# Patient Record
Sex: Male | Born: 1950
Health system: Southern US, Community
[De-identification: ages and names within clinical notes are randomized; demographics above are authoritative.]

## PROBLEM LIST (undated history)

## (undated) DIAGNOSIS — T7840XA Allergy, unspecified, initial encounter: Secondary | ICD-10-CM

## (undated) DIAGNOSIS — Z87442 Personal history of urinary calculi: Secondary | ICD-10-CM

## (undated) DIAGNOSIS — K635 Polyp of colon: Secondary | ICD-10-CM

## (undated) DIAGNOSIS — I10 Essential (primary) hypertension: Secondary | ICD-10-CM

## (undated) DIAGNOSIS — M109 Gout, unspecified: Secondary | ICD-10-CM

## (undated) DIAGNOSIS — C801 Malignant (primary) neoplasm, unspecified: Secondary | ICD-10-CM

## (undated) DIAGNOSIS — N400 Enlarged prostate without lower urinary tract symptoms: Secondary | ICD-10-CM

## (undated) DIAGNOSIS — E785 Hyperlipidemia, unspecified: Secondary | ICD-10-CM

## (undated) DIAGNOSIS — F32A Depression, unspecified: Secondary | ICD-10-CM

## (undated) DIAGNOSIS — K219 Gastro-esophageal reflux disease without esophagitis: Secondary | ICD-10-CM

## (undated) DIAGNOSIS — G4730 Sleep apnea, unspecified: Secondary | ICD-10-CM

## (undated) DIAGNOSIS — D039 Melanoma in situ, unspecified: Secondary | ICD-10-CM

## (undated) DIAGNOSIS — I82409 Acute embolism and thrombosis of unspecified deep veins of unspecified lower extremity: Secondary | ICD-10-CM

## (undated) HISTORY — DX: Malignant (primary) neoplasm, unspecified: C80.1

## (undated) HISTORY — DX: Depression, unspecified: F32.A

## (undated) HISTORY — DX: Benign prostatic hyperplasia without lower urinary tract symptoms: N40.0

## (undated) HISTORY — DX: Polyp of colon: K63.5

## (undated) HISTORY — DX: Sleep apnea, unspecified: G47.30

## (undated) HISTORY — DX: Essential (primary) hypertension: I10

## (undated) HISTORY — DX: Acute embolism and thrombosis of unspecified deep veins of unspecified lower extremity: I82.409

## (undated) HISTORY — DX: Gout, unspecified: M10.9

## (undated) HISTORY — DX: Gastro-esophageal reflux disease without esophagitis: K21.9

## (undated) HISTORY — DX: Hyperlipidemia, unspecified: E78.5

## (undated) HISTORY — DX: Allergy, unspecified, initial encounter: T78.40XA

---
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
## 1997-01-05 DIAGNOSIS — I82409 Acute embolism and thrombosis of unspecified deep veins of unspecified lower extremity: Secondary | ICD-10-CM | POA: Insufficient documentation

## 1997-01-05 HISTORY — DX: Acute embolism and thrombosis of unspecified deep veins of unspecified lower extremity: I82.409

## 2010-05-13 ENCOUNTER — Ambulatory Visit
Admission: RE | Admit: 2010-05-13 | Discharge: 2010-05-13 | Disposition: A | Discharge status: Home / Self Care | Payer: No Typology Code available for payment source | Source: Ambulatory Visit | Attending: Physical Medicine and Rehabilitation | Admitting: Physical Medicine and Rehabilitation

## 2010-05-13 ENCOUNTER — Other Ambulatory Visit: Payer: Self-pay | Admitting: Physical Medicine and Rehabilitation

## 2010-05-13 DIAGNOSIS — Z0289 Encounter for other administrative examinations: Secondary | ICD-10-CM

## 2012-09-07 ENCOUNTER — Telehealth: Payer: Self-pay

## 2012-09-07 NOTE — Telephone Encounter (Signed)
Pt left v/m; moved to [place] area 3 years ago and pts doctor in [place] has been prescribing Lisinopril 40 mg for pt; [place] dr will not refill until seen. Pt has new pt appt 11/01/12. I called pt and he was out of med and pt went to [facility] to get med filled until can see Dr Alphonsus Sias on 11/01/12. Pt said he did not need anything further at this time.

## 2012-09-21 ENCOUNTER — Encounter: Payer: Self-pay | Admitting: Internal Medicine

## 2012-09-21 ENCOUNTER — Ambulatory Visit (INDEPENDENT_AMBULATORY_CARE_PROVIDER_SITE_OTHER): Payer: Managed Care, Other (non HMO) | Admitting: Internal Medicine

## 2012-09-21 VITALS — BP 128/80 | HR 71 | Temp 98.0°F | Ht 70.0 in | Wt 257.0 lb

## 2012-09-21 DIAGNOSIS — I10 Essential (primary) hypertension: Secondary | ICD-10-CM | POA: Insufficient documentation

## 2012-09-21 DIAGNOSIS — E785 Hyperlipidemia, unspecified: Secondary | ICD-10-CM

## 2012-09-21 DIAGNOSIS — M109 Gout, unspecified: Secondary | ICD-10-CM | POA: Insufficient documentation

## 2012-09-21 DIAGNOSIS — Z0000 Encounter for general adult medical examination without abnormal findings: Secondary | ICD-10-CM

## 2012-09-21 DIAGNOSIS — Z125 Encounter for screening for malignant neoplasm of prostate: Secondary | ICD-10-CM

## 2012-09-21 DIAGNOSIS — Z23 Encounter for immunization: Secondary | ICD-10-CM

## 2012-09-21 HISTORY — DX: Encounter for general adult medical examination without abnormal findings: Z00.00

## 2012-09-21 LAB — HEPATIC FUNCTION PANEL
ALT: 26 U/L (ref 0–53)
AST: 28 U/L (ref 0–37)
Alkaline Phosphatase: 58 U/L (ref 39–117)
Total Bilirubin: 1.1 mg/dL (ref 0.3–1.2)

## 2012-09-21 LAB — CBC WITH DIFFERENTIAL/PLATELET
Basophils Relative: 0.7 % (ref 0.0–3.0)
Eosinophils Relative: 1 % (ref 0.0–5.0)
HCT: 43.3 % (ref 39.0–52.0)
Lymphs Abs: 2.6 10*3/uL (ref 0.7–4.0)
MCV: 95.7 fl (ref 78.0–100.0)
Monocytes Absolute: 0.6 10*3/uL (ref 0.1–1.0)
Monocytes Relative: 8.9 % (ref 3.0–12.0)
Neutrophils Relative %: 52.4 % (ref 43.0–77.0)
Platelets: 275 10*3/uL (ref 150.0–400.0)
RBC: 4.52 Mil/uL (ref 4.22–5.81)
WBC: 7 10*3/uL (ref 4.5–10.5)

## 2012-09-21 LAB — BASIC METABOLIC PANEL
BUN: 13 mg/dL (ref 6–23)
Chloride: 104 mEq/L (ref 96–112)
GFR: 76.57 mL/min (ref >60.00)
Potassium: 4.2 mEq/L (ref 3.5–5.1)
Sodium: 141 mEq/L (ref 135–145)

## 2012-09-21 LAB — PSA: PSA: 0.33 ng/mL (ref 0.10–4.00)

## 2012-09-21 LAB — LIPID PANEL
Total CHOL/HDL Ratio: 6
Triglycerides: 147 mg/dL (ref 0.0–149.0)

## 2012-09-21 LAB — TSH: TSH: 3.77 u[IU]/mL (ref 0.35–5.50)

## 2012-09-21 LAB — LDL CHOLESTEROL, DIRECT: Direct LDL: 155.2 mg/dL

## 2012-09-21 LAB — T4, FREE: Free T4: 0.63 ng/dL (ref 0.60–1.60)

## 2012-09-21 MED ORDER — LISINOPRIL 40 MG PO TABS: 40.0000 mg | ORAL_TABLET | Freq: Every day | ORAL | Status: DC

## 2012-09-21 NOTE — Assessment & Plan Note (Signed)
Discussed primary prevention For now-- he wants to hold off

## 2012-09-21 NOTE — Assessment & Plan Note (Signed)
Healthy Working on fitness Will check PSA after discussion Flu, Tdap

## 2012-09-21 NOTE — Assessment & Plan Note (Signed)
BP Readings from Last 3 Encounters:  09/21/12 128/80   Good control Will continue meds Check labs
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
## 2012-09-22 NOTE — Addendum Note (Signed)
Addended by: Sueanne Margarita on: 09/22/2012 11:04 AM   Modules accepted: Orders

## 2012-11-01 ENCOUNTER — Ambulatory Visit: Payer: Self-pay | Admitting: Internal Medicine

## 2013-02-02 ENCOUNTER — Ambulatory Visit (INDEPENDENT_AMBULATORY_CARE_PROVIDER_SITE_OTHER): Payer: Managed Care, Other (non HMO) | Admitting: Internal Medicine

## 2013-02-02 ENCOUNTER — Encounter: Payer: Self-pay | Admitting: Internal Medicine

## 2013-02-02 VITALS — BP 108/60 | HR 98 | Temp 100.3°F | Wt 253.0 lb

## 2013-02-02 DIAGNOSIS — J069 Acute upper respiratory infection, unspecified: Secondary | ICD-10-CM | POA: Insufficient documentation
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
## 2013-02-02 NOTE — Assessment & Plan Note (Signed)
Really seems viral May be attenuated flu  Discussed supportive care If more focal sinus symptoms next week, would Rx amoxicillin

## 2013-02-02 NOTE — Progress Notes (Signed)
Pre-visit discussion using our clinic review tool. No additional management support is needed unless otherwise documented below in the visit note.  

## 2013-02-02 NOTE — Patient Instructions (Signed)

## 2013-02-08 ENCOUNTER — Encounter: Payer: Self-pay | Admitting: *Deleted

## 2013-09-25 ENCOUNTER — Encounter: Payer: Self-pay | Admitting: Internal Medicine

## 2013-09-25 ENCOUNTER — Ambulatory Visit (INDEPENDENT_AMBULATORY_CARE_PROVIDER_SITE_OTHER): Payer: Managed Care, Other (non HMO) | Admitting: Internal Medicine

## 2013-09-25 VITALS — BP 110/70 | HR 63 | Temp 98.2°F | Ht 70.0 in | Wt 244.0 lb

## 2013-09-25 DIAGNOSIS — I10 Essential (primary) hypertension: Secondary | ICD-10-CM

## 2013-09-25 DIAGNOSIS — E785 Hyperlipidemia, unspecified: Secondary | ICD-10-CM

## 2013-09-25 DIAGNOSIS — Z0000 Encounter for general adult medical examination without abnormal findings: Secondary | ICD-10-CM

## 2013-09-25 LAB — COMPREHENSIVE METABOLIC PANEL
ALBUMIN: 4.2 g/dL (ref 3.5–5.2)
ALK PHOS: 63 U/L (ref 39–117)
ALT: 29 U/L (ref 0–53)
AST: 25 U/L (ref 0–37)
BUN: 14 mg/dL (ref 6–23)
CALCIUM: 9.4 mg/dL (ref 8.4–10.5)
CHLORIDE: 106 meq/L (ref 96–112)
CO2: 31 mEq/L (ref 19–32)
Creatinine, Ser: 1 mg/dL (ref 0.4–1.5)
GFR: 78.9 mL/min (ref >60.00)
Glucose, Bld: 93 mg/dL (ref 70–99)
POTASSIUM: 4.6 meq/L (ref 3.5–5.1)
SODIUM: 142 meq/L (ref 135–145)
TOTAL PROTEIN: 7.8 g/dL (ref 6.0–8.3)
Total Bilirubin: 0.9 mg/dL (ref 0.2–1.2)

## 2013-09-25 LAB — CBC WITH DIFFERENTIAL/PLATELET
BASOS ABS: 0 10*3/uL (ref 0.0–0.1)
Basophils Relative: 0.5 % (ref 0.0–3.0)
EOS ABS: 0.1 10*3/uL (ref 0.0–0.7)
Eosinophils Relative: 1.3 % (ref 0.0–5.0)
HCT: 44 % (ref 39.0–52.0)
Hemoglobin: 14.8 g/dL (ref 13.0–17.0)
LYMPHS PCT: 27.7 % (ref 12.0–46.0)
Lymphs Abs: 1.6 10*3/uL (ref 0.7–4.0)
MCHC: 33.7 g/dL (ref 30.0–36.0)
MCV: 95.8 fl (ref 78.0–100.0)
MONO ABS: 0.4 10*3/uL (ref 0.1–1.0)
Monocytes Relative: 7.6 % (ref 3.0–12.0)
NEUTROS PCT: 62.9 % (ref 43.0–77.0)
Neutro Abs: 3.7 10*3/uL (ref 1.4–7.7)
PLATELETS: 274 10*3/uL (ref 150.0–400.0)
RBC: 4.59 Mil/uL (ref 4.22–5.81)
RDW: 13.4 % (ref 11.5–15.5)
WBC: 5.8 10*3/uL (ref 4.0–10.5)

## 2013-09-25 LAB — LIPID PANEL
CHOLESTEROL: 192 mg/dL (ref 0–200)
HDL: 27.4 mg/dL — ABNORMAL LOW (ref >39.00)
LDL CALC: 128 mg/dL — AB (ref 0–99)
NonHDL: 164.6
Total CHOL/HDL Ratio: 7
Triglycerides: 182 mg/dL — ABNORMAL HIGH (ref 0.0–149.0)
VLDL: 36.4 mg/dL (ref 0.0–40.0)

## 2013-09-25 LAB — T4, FREE: FREE T4: 0.65 ng/dL (ref 0.60–1.60)

## 2013-09-25 NOTE — Assessment & Plan Note (Signed)
BP Readings from Last 3 Encounters:  09/25/13 110/70  02/02/13 108/60  09/21/12 128/80   Will have him try cutting lisinopril in half
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
## 2013-09-25 NOTE — Assessment & Plan Note (Signed)
Healthy Really doing well with lifestyle UTD on cancer screening Will get flu vaccine at work

## 2013-09-25 NOTE — Progress Notes (Signed)
Pre visit review using our clinic review tool, if applicable. No additional management support is needed unless otherwise documented below in the visit note. 

## 2013-09-25 NOTE — Patient Instructions (Signed)
Please cut the lisinopril in half. Check your blood pressure after 3-4 weeks and stay on the lower dose as long as it is under 130/85.

## 2013-09-25 NOTE — Assessment & Plan Note (Signed)
Will recheck No meds for this at this point

## 2013-10-01 ENCOUNTER — Other Ambulatory Visit: Payer: Self-pay | Admitting: Internal Medicine

## 2014-10-03 ENCOUNTER — Encounter: Payer: Self-pay | Admitting: Internal Medicine

## 2014-10-03 ENCOUNTER — Ambulatory Visit (INDEPENDENT_AMBULATORY_CARE_PROVIDER_SITE_OTHER): Payer: 59 | Admitting: Internal Medicine

## 2014-10-03 VITALS — BP 112/72 | HR 60 | Temp 97.8°F | Ht 70.0 in | Wt 244.0 lb

## 2014-10-03 DIAGNOSIS — Z0000 Encounter for general adult medical examination without abnormal findings: Secondary | ICD-10-CM

## 2014-10-03 DIAGNOSIS — I10 Essential (primary) hypertension: Secondary | ICD-10-CM | POA: Diagnosis not present

## 2014-10-03 DIAGNOSIS — Z125 Encounter for screening for malignant neoplasm of prostate: Secondary | ICD-10-CM | POA: Diagnosis not present

## 2014-10-03 DIAGNOSIS — E785 Hyperlipidemia, unspecified: Secondary | ICD-10-CM

## 2014-10-03 LAB — CBC WITH DIFFERENTIAL/PLATELET
BASOS ABS: 0.1 10*3/uL (ref 0.0–0.1)
Basophils Relative: 0.9 % (ref 0.0–3.0)
EOS ABS: 0.1 10*3/uL (ref 0.0–0.7)
Eosinophils Relative: 1.5 % (ref 0.0–5.0)
HEMATOCRIT: 42.6 % (ref 39.0–52.0)
HEMOGLOBIN: 14.4 g/dL (ref 13.0–17.0)
LYMPHS PCT: 34 % (ref 12.0–46.0)
Lymphs Abs: 2 10*3/uL (ref 0.7–4.0)
MCHC: 33.8 g/dL (ref 30.0–36.0)
MCV: 95.2 fl (ref 78.0–100.0)
MONOS PCT: 9.9 % (ref 3.0–12.0)
Monocytes Absolute: 0.6 10*3/uL (ref 0.1–1.0)
Neutro Abs: 3.2 10*3/uL (ref 1.4–7.7)
Neutrophils Relative %: 53.7 % (ref 43.0–77.0)
Platelets: 250 10*3/uL (ref 150.0–400.0)
RBC: 4.48 Mil/uL (ref 4.22–5.81)
RDW: 13.5 % (ref 11.5–15.5)
WBC: 6 10*3/uL (ref 4.0–10.5)

## 2014-10-03 LAB — COMPREHENSIVE METABOLIC PANEL
ALBUMIN: 4.1 g/dL (ref 3.5–5.2)
ALT: 30 U/L (ref 0–53)
AST: 23 U/L (ref 0–37)
Alkaline Phosphatase: 61 U/L (ref 39–117)
BILIRUBIN TOTAL: 0.8 mg/dL (ref 0.2–1.2)
BUN: 13 mg/dL (ref 6–23)
CALCIUM: 9.3 mg/dL (ref 8.4–10.5)
CHLORIDE: 105 meq/L (ref 96–112)
CO2: 33 meq/L — AB (ref 19–32)
CREATININE: 1 mg/dL (ref 0.40–1.50)
GFR: 80.46 mL/min (ref >60.00)
Glucose, Bld: 89 mg/dL (ref 70–99)
Potassium: 4.4 mEq/L (ref 3.5–5.1)
Sodium: 142 mEq/L (ref 135–145)
Total Protein: 6.7 g/dL (ref 6.0–8.3)

## 2014-10-03 LAB — LIPID PANEL
CHOL/HDL RATIO: 6
Cholesterol: 193 mg/dL (ref 0–200)
HDL: 32.5 mg/dL — AB (ref >39.00)
LDL Cholesterol: 129 mg/dL — ABNORMAL HIGH (ref 0–99)
NONHDL: 160.35
Triglycerides: 158 mg/dL — ABNORMAL HIGH (ref 0.0–149.0)
VLDL: 31.6 mg/dL (ref 0.0–40.0)

## 2014-10-03 LAB — PSA: PSA: 0.38 ng/mL (ref 0.10–4.00)

## 2014-10-03 MED ORDER — LISINOPRIL 20 MG PO TABS: 20.0000 mg | ORAL_TABLET | Freq: Every day | ORAL | Status: DC

## 2014-10-03 NOTE — Patient Instructions (Signed)
Please try to get the colonoscopy report and pathology report (if done on polyps) from [facility].

## 2014-10-03 NOTE — Assessment & Plan Note (Signed)
Healthy and trying to work on lifestyle May be due for colon next year--he will try to get records from [facility] Will check PSA after discussion Flu vaccine at work

## 2014-10-03 NOTE — Assessment & Plan Note (Signed)
Discussed primary prevention Mostly low HDL No FH of early CAD--will hold off on meds

## 2014-10-03 NOTE — Progress Notes (Signed)
Pre visit review using our clinic review tool, if applicable. No additional management support is needed unless otherwise documented below in the visit note. 
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
## 2014-10-03 NOTE — Assessment & Plan Note (Signed)
BP Readings from Last 3 Encounters:  10/03/14 112/72  09/25/13 110/70  02/02/13 108/60   Good control No change

## 2014-10-03 NOTE — Addendum Note (Signed)
Addended by: Despina Hidden on: 10/03/2014 11:39 AM   Modules accepted: Orders, Medications

## 2015-10-04 ENCOUNTER — Encounter: Payer: Self-pay | Admitting: Internal Medicine

## 2015-10-04 ENCOUNTER — Ambulatory Visit (INDEPENDENT_AMBULATORY_CARE_PROVIDER_SITE_OTHER): Payer: 59 | Admitting: Internal Medicine

## 2015-10-04 ENCOUNTER — Other Ambulatory Visit: Payer: Self-pay | Admitting: Internal Medicine

## 2015-10-04 VITALS — BP 122/86 | HR 67 | Temp 98.4°F | Ht 70.0 in | Wt 238.0 lb

## 2015-10-04 DIAGNOSIS — Z0000 Encounter for general adult medical examination without abnormal findings: Secondary | ICD-10-CM

## 2015-10-04 DIAGNOSIS — I10 Essential (primary) hypertension: Secondary | ICD-10-CM | POA: Diagnosis not present

## 2015-10-04 DIAGNOSIS — N400 Enlarged prostate without lower urinary tract symptoms: Secondary | ICD-10-CM | POA: Insufficient documentation

## 2015-10-04 LAB — CBC WITH DIFFERENTIAL/PLATELET
BASOS ABS: 0 10*3/uL (ref 0.0–0.1)
BASOS PCT: 0.5 % (ref 0.0–3.0)
EOS ABS: 0.1 10*3/uL (ref 0.0–0.7)
Eosinophils Relative: 1.1 % (ref 0.0–5.0)
HCT: 43.3 % (ref 39.0–52.0)
Hemoglobin: 14.8 g/dL (ref 13.0–17.0)
LYMPHS ABS: 1.9 10*3/uL (ref 0.7–4.0)
Lymphocytes Relative: 32.5 % (ref 12.0–46.0)
MCHC: 34.3 g/dL (ref 30.0–36.0)
MCV: 95.1 fl (ref 78.0–100.0)
MONO ABS: 0.5 10*3/uL (ref 0.1–1.0)
Monocytes Relative: 8.7 % (ref 3.0–12.0)
NEUTROS PCT: 57.2 % (ref 43.0–77.0)
Neutro Abs: 3.4 10*3/uL (ref 1.4–7.7)
PLATELETS: 248 10*3/uL (ref 150.0–400.0)
RBC: 4.55 Mil/uL (ref 4.22–5.81)
RDW: 13.5 % (ref 11.5–15.5)
WBC: 6 10*3/uL (ref 4.0–10.5)

## 2015-10-04 LAB — LIPID PANEL
Cholesterol: 200 mg/dL (ref 0–200)
HDL: 32.3 mg/dL — AB (ref >39.00)
LDL Cholesterol: 147 mg/dL — ABNORMAL HIGH (ref 0–99)
NonHDL: 168.16
TRIGLYCERIDES: 108 mg/dL (ref 0.0–149.0)
Total CHOL/HDL Ratio: 6
VLDL: 21.6 mg/dL (ref 0.0–40.0)

## 2015-10-04 LAB — COMPREHENSIVE METABOLIC PANEL
ALT: 20 U/L (ref 0–53)
AST: 23 U/L (ref 0–37)
Albumin: 4 g/dL (ref 3.5–5.2)
Alkaline Phosphatase: 60 U/L (ref 39–117)
BILIRUBIN TOTAL: 0.9 mg/dL (ref 0.2–1.2)
BUN: 12 mg/dL (ref 6–23)
CALCIUM: 9 mg/dL (ref 8.4–10.5)
CO2: 31 meq/L (ref 19–32)
CREATININE: 0.99 mg/dL (ref 0.40–1.50)
Chloride: 105 mEq/L (ref 96–112)
GFR: 81.13 mL/min (ref >60.00)
GLUCOSE: 79 mg/dL (ref 70–99)
Potassium: 4.3 mEq/L (ref 3.5–5.1)
SODIUM: 141 meq/L (ref 135–145)
Total Protein: 7.4 g/dL (ref 6.0–8.3)

## 2015-10-04 MED ORDER — ZOSTER VACCINE LIVE 19400 UNT/0.65ML ~~LOC~~ SUSR: 0.6500 mL | Freq: Once | SUBCUTANEOUS | 0 refills | Status: AC

## 2015-10-04 NOTE — Assessment & Plan Note (Signed)
BP Readings from Last 3 Encounters:  10/04/15 122/86  10/03/14 112/72  09/25/13 110/70   Good control No changes needed
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
## 2015-10-04 NOTE — Assessment & Plan Note (Signed)
Healthy Has improved fitness and gradual weight loss Flu vaccine at work Rx for zostavax Defer PSA till at least next year Need his colon records--may be due for repeat now

## 2015-10-04 NOTE — Progress Notes (Signed)
Pre visit review using our clinic review tool, if applicable. No additional management support is needed unless otherwise documented below in the visit note. 

## 2015-10-04 NOTE — Assessment & Plan Note (Signed)
Mild symptoms ?No Rx needed ?

## 2015-10-08 LAB — NICOTINE/COTININE METABOLITES: Cotinine: NEGATIVE

## 2015-12-10 ENCOUNTER — Ambulatory Visit (INDEPENDENT_AMBULATORY_CARE_PROVIDER_SITE_OTHER): Payer: 59 | Admitting: Family Medicine

## 2015-12-10 ENCOUNTER — Encounter: Payer: Self-pay | Admitting: Family Medicine

## 2015-12-10 VITALS — BP 122/84 | HR 80 | Temp 98.5°F | Ht 70.0 in | Wt 246.8 lb

## 2015-12-10 DIAGNOSIS — J0100 Acute maxillary sinusitis, unspecified: Secondary | ICD-10-CM

## 2015-12-10 DIAGNOSIS — J019 Acute sinusitis, unspecified: Secondary | ICD-10-CM | POA: Insufficient documentation

## 2015-12-10 MED ORDER — AMOXICILLIN-POT CLAVULANATE 875-125 MG PO TABS: 1.0000 | ORAL_TABLET | Freq: Two times a day (BID) | ORAL | 0 refills | Status: DC

## 2015-12-10 NOTE — Progress Notes (Signed)
Pre visit review using our clinic review tool, if applicable. No additional management support is needed unless otherwise documented below in the visit note. 

## 2015-12-10 NOTE — Patient Instructions (Signed)
I think you have a sinus infection  Drink lots of fluids  Take the augmentin as directed  Use your sinus rinse  Breathe steam when you can   Tylenol is fine for pain or fever   Update if not starting to improve in a week or if worsening

## 2015-12-10 NOTE — Progress Notes (Signed)
   Subjective:    Patient ID: Cody Romero, male    DOB: [birth year], 65 y.o.   MRN: [id number]  HPI Here for 2 weeks of uri symptoms   Started with a productive cough - mucous was light green  That has calmed down   Felt a little better and then worse  A lot of facial pain - worse on the left side  Kept him in bed this weekend   Lots of nasal mucous- dark green  ? If fever- felt hot at work yesterday  Has had chills as well   Ears pop a bit  No ST   Over the counter- took ES sudafed  Also mucinex (tried sinus formula and then another for chest congestion)   Tylenol helps more than anything   He is allergic to cefzil but can take penicillin    Review of Systems  Constitutional: Positive for appetite change. Negative for activity change, fatigue and fever.  HENT: Positive for congestion, ear pain, postnasal drip, rhinorrhea, sinus pressure and sore throat. Negative for nosebleeds.   Eyes: Negative for pain, redness, itching and visual disturbance.  Respiratory: Positive for cough. Negative for shortness of breath and wheezing.   Cardiovascular: Negative for chest pain and leg swelling.  Gastrointestinal: Negative for abdominal distention, abdominal pain, constipation, diarrhea, nausea and vomiting.  Endocrine: Negative for cold intolerance, polydipsia and polyuria.  Genitourinary: Negative for difficulty urinating, dysuria, flank pain, frequency, hematuria and urgency.  Musculoskeletal: Negative for arthralgias and myalgias.  Skin: Negative for rash.  Allergic/Immunologic: Negative for immunocompromised state.  Neurological: Positive for headaches. Negative for dizziness, tremors, syncope, weakness and numbness.  Hematological: Negative for adenopathy. Does not bruise/bleed easily.  Psychiatric/Behavioral: Negative for dysphoric mood. The patient is not nervous/anxious.        Objective:   Physical Exam  Constitutional: He appears well-developed and well-nourished. No  distress.  Well appearing   HENT:  Head: Normocephalic and atraumatic.  Right Ear: External ear normal.  Left Ear: External ear normal.  Mouth/Throat: Oropharynx is clear and moist. No oropharyngeal exudate.  Mild maxillary sinus tenderness Nares are injected and congested     Eyes: Conjunctivae and EOM are normal. Pupils are equal, round, and reactive to light. Right eye exhibits no discharge. Left eye exhibits no discharge.  Neck: Normal range of motion. Neck supple.  Cardiovascular: Normal rate and regular rhythm.   Pulmonary/Chest: Effort normal and breath sounds normal. No respiratory distress. He has no wheezes. He has no rales. He exhibits no tenderness.  Lymphadenopathy:    He has no cervical adenopathy.  Neurological: He is alert. No cranial nerve deficit.  Skin: Skin is warm and dry. No rash noted. No erythema.  Psychiatric: He has a normal mood and affect.          Assessment & Plan:   Problem List Items Addressed This Visit      Respiratory   Acute sinusitis    After 2 wk of uri symptoms - with sinus pain and purulent nasal drainage Cover with augmentin (pt is all to cefzil but can take augmentin)  Fluids/rest/nasal saline Disc symptomatic care - see instructions on AVS  Update if not starting to improve in a week or if worsening        Relevant Medications   amoxicillin-clavulanate (AUGMENTIN) 875-125 MG tablet

## 2015-12-10 NOTE — Assessment & Plan Note (Signed)
After 2 wk of uri symptoms - with sinus pain and purulent nasal drainage Cover with augmentin (pt is all to cefzil but can take augmentin)  Fluids/rest/nasal saline Disc symptomatic care - see instructions on AVS  Update if not starting to improve in a week or if worsening

## 2015-12-20 ENCOUNTER — Telehealth: Payer: Self-pay

## 2015-12-20 MED ORDER — AMOXICILLIN-POT CLAVULANATE 875-125 MG PO TABS: 1.0000 | ORAL_TABLET | Freq: Two times a day (BID) | ORAL | 0 refills | Status: DC

## 2015-12-20 NOTE — Telephone Encounter (Signed)
Pt left v/m requesting refill augmentin; pt was seen 12/10/15; pt has finished abx and is better with sinus infection but symptoms have not completely cleared and request refill abx to get completely clear of symptoms.[facility]. Pt request cb.

## 2015-12-20 NOTE — Telephone Encounter (Signed)
Rx sent to pharmacy and pt advise and advise of Dr. Marliss Coots comments

## 2015-12-20 NOTE — Telephone Encounter (Signed)
Please refill for another 5 days F/u if no further improvement  Hope this helps

## 2016-04-20 ENCOUNTER — Ambulatory Visit (INDEPENDENT_AMBULATORY_CARE_PROVIDER_SITE_OTHER): Payer: 59 | Admitting: Internal Medicine

## 2016-04-20 ENCOUNTER — Encounter: Payer: Self-pay | Admitting: Internal Medicine

## 2016-04-20 VITALS — BP 116/72 | HR 92 | Temp 99.1°F | Wt 251.8 lb

## 2016-04-20 DIAGNOSIS — J321 Chronic frontal sinusitis: Secondary | ICD-10-CM | POA: Insufficient documentation

## 2016-04-20 MED ORDER — FLUTICASONE PROPIONATE 50 MCG/ACT NA SUSP: 2.0000 | Freq: Two times a day (BID) | NASAL | 12 refills | Status: DC

## 2016-04-20 MED ORDER — AMOXICILLIN-POT CLAVULANATE 875-125 MG PO TABS: 1.0000 | ORAL_TABLET | Freq: Two times a day (BID) | ORAL | 0 refills | Status: AC

## 2016-04-20 NOTE — Patient Instructions (Signed)
If you are not getting better over the next few weeks, I will set you up with an ENT specialist.

## 2016-04-20 NOTE — Progress Notes (Signed)
Pre visit review using our clinic review tool, if applicable. No additional management support is needed unless otherwise documented below in the visit note. 

## 2016-04-20 NOTE — Assessment & Plan Note (Signed)
Ongoing symptoms for 4-5 months Discussed nasal irrigation and flonase Will give augmentin for 3 weeks Will proceed with ENT eval if persists
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
## 2016-09-16 ENCOUNTER — Ambulatory Visit (INDEPENDENT_AMBULATORY_CARE_PROVIDER_SITE_OTHER): Payer: 59 | Admitting: Internal Medicine

## 2016-09-16 ENCOUNTER — Encounter: Payer: Self-pay | Admitting: Internal Medicine

## 2016-09-16 VITALS — BP 114/74 | HR 65 | Temp 98.0°F | Ht 69.25 in | Wt 247.0 lb

## 2016-09-16 DIAGNOSIS — I10 Essential (primary) hypertension: Secondary | ICD-10-CM | POA: Diagnosis not present

## 2016-09-16 DIAGNOSIS — D225 Melanocytic nevi of trunk: Secondary | ICD-10-CM | POA: Diagnosis not present

## 2016-09-16 DIAGNOSIS — Z125 Encounter for screening for malignant neoplasm of prostate: Secondary | ICD-10-CM | POA: Diagnosis not present

## 2016-09-16 DIAGNOSIS — Z85820 Personal history of malignant melanoma of skin: Secondary | ICD-10-CM

## 2016-09-16 DIAGNOSIS — Z0000 Encounter for general adult medical examination without abnormal findings: Secondary | ICD-10-CM | POA: Diagnosis not present

## 2016-09-16 HISTORY — DX: Personal history of malignant melanoma of skin: Z85.820

## 2016-09-16 LAB — LIPID PANEL
Cholesterol: 220 mg/dL — ABNORMAL HIGH (ref 0–200)
HDL: 42.1 mg/dL (ref >39.00)
LDL Cholesterol: 141 mg/dL — ABNORMAL HIGH (ref 0–99)
NONHDL: 178.34
Total CHOL/HDL Ratio: 5
Triglycerides: 188 mg/dL — ABNORMAL HIGH (ref 0.0–149.0)
VLDL: 37.6 mg/dL (ref 0.0–40.0)

## 2016-09-16 LAB — CBC WITH DIFFERENTIAL/PLATELET
BASOS ABS: 0.1 10*3/uL (ref 0.0–0.1)
Basophils Relative: 0.9 % (ref 0.0–3.0)
Eosinophils Absolute: 0.1 10*3/uL (ref 0.0–0.7)
Eosinophils Relative: 1.7 % (ref 0.0–5.0)
HEMATOCRIT: 43.9 % (ref 39.0–52.0)
Hemoglobin: 14.7 g/dL (ref 13.0–17.0)
LYMPHS PCT: 37 % (ref 12.0–46.0)
Lymphs Abs: 2.2 10*3/uL (ref 0.7–4.0)
MCHC: 33.4 g/dL (ref 30.0–36.0)
MCV: 98.5 fl (ref 78.0–100.0)
MONOS PCT: 10.3 % (ref 3.0–12.0)
Monocytes Absolute: 0.6 10*3/uL (ref 0.1–1.0)
NEUTROS ABS: 3 10*3/uL (ref 1.4–7.7)
Neutrophils Relative %: 50.1 % (ref 43.0–77.0)
PLATELETS: 259 10*3/uL (ref 150.0–400.0)
RBC: 4.46 Mil/uL (ref 4.22–5.81)
RDW: 13.7 % (ref 11.5–15.5)
WBC: 5.9 10*3/uL (ref 4.0–10.5)

## 2016-09-16 LAB — COMPREHENSIVE METABOLIC PANEL
ALT: 24 U/L (ref 0–53)
AST: 21 U/L (ref 0–37)
Albumin: 4.2 g/dL (ref 3.5–5.2)
Alkaline Phosphatase: 62 U/L (ref 39–117)
BILIRUBIN TOTAL: 0.8 mg/dL (ref 0.2–1.2)
BUN: 17 mg/dL (ref 6–23)
CALCIUM: 9.7 mg/dL (ref 8.4–10.5)
CO2: 30 meq/L (ref 19–32)
Chloride: 103 mEq/L (ref 96–112)
Creatinine, Ser: 1.06 mg/dL (ref 0.40–1.50)
GFR: 74.75 mL/min (ref >60.00)
GLUCOSE: 94 mg/dL (ref 70–99)
POTASSIUM: 4.8 meq/L (ref 3.5–5.1)
Sodium: 141 mEq/L (ref 135–145)
Total Protein: 6.8 g/dL (ref 6.0–8.3)

## 2016-09-16 LAB — PSA: PSA: 0.42 ng/mL (ref 0.10–4.00)

## 2016-09-16 NOTE — Assessment & Plan Note (Signed)
BP Readings from Last 3 Encounters:  09/16/16 114/74  04/20/16 116/72  12/10/15 122/84   Good control

## 2016-09-16 NOTE — Assessment & Plan Note (Signed)
Healthy but discussed obesity DASH info  Exercising regularly Had colon 2010--2 polyps (will get pathology and set up if adenomatous) Discussed PSA--will check

## 2016-09-16 NOTE — Assessment & Plan Note (Signed)
Will set up with derm 

## 2016-09-16 NOTE — Patient Instructions (Signed)
DASH Eating Plan DASH stands for "Dietary Approaches to Stop Hypertension." The DASH eating plan is a healthy eating plan that has been shown to reduce high blood pressure (hypertension). It may also reduce your risk for type 2 diabetes, heart disease, and stroke. The DASH eating plan may also help with weight loss. What are tips for following this plan? General guidelines  Avoid eating more than 2,300 mg (milligrams) of salt (sodium) a day. If you have hypertension, you may need to reduce your sodium intake to 1,500 mg a day.  Limit alcohol intake to no more than 1 drink a day for nonpregnant women and 2 drinks a day for men. One drink equals 12 oz of beer, 5 oz of wine, or 1 oz of hard liquor.  Work with your health care provider to maintain a healthy body weight or to lose weight. Ask what an ideal weight is for you.  Get at least 30 minutes of exercise that causes your heart to beat faster (aerobic exercise) most days of the week. Activities may include walking, swimming, or biking.  Work with your health care provider or diet and nutrition specialist (dietitian) to adjust your eating plan to your individual calorie needs. Reading food labels  Check food labels for the amount of sodium per serving. Choose foods with less than 5 percent of the Daily Value of sodium. Generally, foods with less than 300 mg of sodium per serving fit into this eating plan.  To find whole grains, look for the word "whole" as the first word in the ingredient list. Shopping  Buy products labeled as "low-sodium" or "no salt added."  Buy fresh foods. Avoid canned foods and premade or frozen meals. Cooking  Avoid adding salt when cooking. Use salt-free seasonings or herbs instead of table salt or sea salt. Check with your health care provider or pharmacist before using salt substitutes.  Do not fry foods. Cook foods using healthy methods such as baking, boiling, grilling, and broiling instead.  Cook with  heart-healthy oils, such as olive, canola, soybean, or sunflower oil. Meal planning   Eat a balanced diet that includes: ? 5 or more servings of fruits and vegetables each day. At each meal, try to fill half of your plate with fruits and vegetables. ? Up to 6-8 servings of whole grains each day. ? Less than 6 oz of lean meat, poultry, or fish each day. A 3-oz serving of meat is about the same size as a deck of cards. One egg equals 1 oz. ? 2 servings of low-fat dairy each day. ? A serving of nuts, seeds, or beans 5 times each week. ? Heart-healthy fats. Healthy fats called Omega-3 fatty acids are found in foods such as flaxseeds and coldwater fish, like sardines, salmon, and mackerel.  Limit how much you eat of the following: ? Canned or prepackaged foods. ? Food that is high in trans fat, such as fried foods. ? Food that is high in saturated fat, such as fatty meat. ? Sweets, desserts, sugary drinks, and other foods with added sugar. ? Full-fat dairy products.  Do not salt foods before eating.  Try to eat at least 2 vegetarian meals each week.  Eat more home-cooked food and less restaurant, buffet, and fast food.  When eating at a restaurant, ask that your food be prepared with less salt or no salt, if possible. What foods are recommended? The items listed may not be a complete list. Talk with your dietitian about what   dietary choices are best for you. Grains Whole-grain or whole-wheat bread. Whole-grain or whole-wheat pasta. Brown rice. Oatmeal. Quinoa. Bulgur. Whole-grain and low-sodium cereals. Pita bread. Low-fat, low-sodium crackers. Whole-wheat flour tortillas. Vegetables Fresh or frozen vegetables (raw, steamed, roasted, or grilled). Low-sodium or reduced-sodium tomato and vegetable juice. Low-sodium or reduced-sodium tomato sauce and tomato paste. Low-sodium or reduced-sodium canned vegetables. Fruits All fresh, dried, or frozen fruit. Canned fruit in natural juice (without  added sugar). Meat and other protein foods Skinless chicken or turkey. Ground chicken or turkey. Pork with fat trimmed off. Fish and seafood. Egg whites. Dried beans, peas, or lentils. Unsalted nuts, nut butters, and seeds. Unsalted canned beans. Lean cuts of beef with fat trimmed off. Low-sodium, lean deli meat. Dairy Low-fat (1%) or fat-free (skim) milk. Fat-free, low-fat, or reduced-fat cheeses. Nonfat, low-sodium ricotta or cottage cheese. Low-fat or nonfat yogurt. Low-fat, low-sodium cheese. Fats and oils Soft margarine without trans fats. Vegetable oil. Low-fat, reduced-fat, or light mayonnaise and salad dressings (reduced-sodium). Canola, safflower, olive, soybean, and sunflower oils. Avocado. Seasoning and other foods Herbs. Spices. Seasoning mixes without salt. Unsalted popcorn and pretzels. Fat-free sweets. What foods are not recommended? The items listed may not be a complete list. Talk with your dietitian about what dietary choices are best for you. Grains Baked goods made with fat, such as croissants, muffins, or some breads. Dry pasta or rice meal packs. Vegetables Creamed or fried vegetables. Vegetables in a cheese sauce. Regular canned vegetables (not low-sodium or reduced-sodium). Regular canned tomato sauce and paste (not low-sodium or reduced-sodium). Regular tomato and vegetable juice (not low-sodium or reduced-sodium). Pickles. Olives. Fruits Canned fruit in a light or heavy syrup. Fried fruit. Fruit in cream or butter sauce. Meat and other protein foods Fatty cuts of meat. Ribs. Fried meat. Bacon. Sausage. Bologna and other processed lunch meats. Salami. Fatback. Hotdogs. Bratwurst. Salted nuts and seeds. Canned beans with added salt. Canned or smoked fish. Whole eggs or egg yolks. Chicken or turkey with skin. Dairy Whole or 2% milk, cream, and half-and-half. Whole or full-fat cream cheese. Whole-fat or sweetened yogurt. Full-fat cheese. Nondairy creamers. Whipped toppings.  Processed cheese and cheese spreads. Fats and oils Butter. Stick margarine. Lard. Shortening. Ghee. Bacon fat. Tropical oils, such as coconut, palm kernel, or palm oil. Seasoning and other foods Salted popcorn and pretzels. Onion salt, garlic salt, seasoned salt, table salt, and sea salt. Worcestershire sauce. Tartar sauce. Barbecue sauce. Teriyaki sauce. Soy sauce, including reduced-sodium. Steak sauce. Canned and packaged gravies. Fish sauce. Oyster sauce. Cocktail sauce. Horseradish that you find on the shelf. Ketchup. Mustard. Meat flavorings and tenderizers. Bouillon cubes. Hot sauce and Tabasco sauce. Premade or packaged marinades. Premade or packaged taco seasonings. Relishes. Regular salad dressings. Where to find more information:  National Heart, Lung, and Blood Institute: www.nhlbi.nih.gov  American Heart Association: www.heart.org Summary  The DASH eating plan is a healthy eating plan that has been shown to reduce high blood pressure (hypertension). It may also reduce your risk for type 2 diabetes, heart disease, and stroke.  With the DASH eating plan, you should limit salt (sodium) intake to 2,300 mg a day. If you have hypertension, you may need to reduce your sodium intake to 1,500 mg a day.  When on the DASH eating plan, aim to eat more fresh fruits and vegetables, whole grains, lean proteins, low-fat dairy, and heart-healthy fats.  Work with your health care provider or diet and nutrition specialist (dietitian) to adjust your eating plan to your individual   calorie needs. This information is not intended to replace advice given to you by your health care provider. Make sure you discuss any questions you have with your health care provider. Document Released: 12/11/2010 Document Revised: 12/16/2015 Document Reviewed: 12/16/2015 Elsevier Interactive Patient Education  2017 Elsevier Inc.  
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
## 2016-10-05 ENCOUNTER — Other Ambulatory Visit: Payer: Self-pay | Admitting: Internal Medicine

## 2016-10-06 ENCOUNTER — Encounter: Payer: 59 | Admitting: Internal Medicine

## 2017-01-18 ENCOUNTER — Telehealth: Payer: Self-pay

## 2017-01-18 DIAGNOSIS — D369 Benign neoplasm, unspecified site: Secondary | ICD-10-CM

## 2017-01-18 NOTE — Telephone Encounter (Signed)
Spoke to pt about his previous colonoscopy and that he is due for another one. He said either [facility] or [place] is fine. He lives between both.   He also wanted Dr Silvio Pate to know that the dermatology appt for the spot on his back was moved from 01-07-17 to some time in February

## 2017-01-18 NOTE — Telephone Encounter (Signed)
Referral placed.

## 2017-02-17 ENCOUNTER — Other Ambulatory Visit: Payer: Self-pay

## 2017-02-18 ENCOUNTER — Telehealth: Payer: Self-pay | Admitting: Gastroenterology

## 2017-02-18 NOTE — Telephone Encounter (Signed)
Records have been placed on DOD 01/18/17 Dr.Armbruster's desk for review.

## 2017-02-23 ENCOUNTER — Encounter: Payer: Self-pay | Admitting: Gastroenterology

## 2017-03-16 ENCOUNTER — Ambulatory Visit: Payer: 59

## 2017-03-16 ENCOUNTER — Other Ambulatory Visit: Payer: Self-pay

## 2017-03-16 ENCOUNTER — Encounter: Payer: Self-pay | Admitting: Gastroenterology

## 2017-03-16 VITALS — Ht 69.5 in | Wt 264.4 lb

## 2017-03-16 DIAGNOSIS — Z86010 Personal history of colonic polyps: Secondary | ICD-10-CM

## 2017-03-16 MED ORDER — NA SULFATE-K SULFATE-MG SULF 17.5-3.13-1.6 GM/177ML PO SOLN: 1.0000 | Freq: Once | ORAL | 0 refills | Status: AC

## 2017-03-16 NOTE — Progress Notes (Signed)
Denies allergies to eggs or soy products. Denies complication of anesthesia or sedation. Denies use of weight loss medication. Denies use of O2.   Emmi instructions declined.  

## 2017-03-30 ENCOUNTER — Other Ambulatory Visit: Payer: Self-pay

## 2017-03-30 ENCOUNTER — Ambulatory Visit (AMBULATORY_SURGERY_CENTER): Payer: 59 | Admitting: Gastroenterology

## 2017-03-30 ENCOUNTER — Encounter: Payer: Self-pay | Admitting: Gastroenterology

## 2017-03-30 VITALS — BP 136/87 | HR 60 | Temp 98.4°F | Resp 24 | Ht 69.5 in | Wt 264.0 lb

## 2017-03-30 DIAGNOSIS — D124 Benign neoplasm of descending colon: Secondary | ICD-10-CM | POA: Diagnosis not present

## 2017-03-30 DIAGNOSIS — D122 Benign neoplasm of ascending colon: Secondary | ICD-10-CM

## 2017-03-30 DIAGNOSIS — Z860100 Personal history of colon polyps, unspecified: Secondary | ICD-10-CM

## 2017-03-30 DIAGNOSIS — D123 Benign neoplasm of transverse colon: Secondary | ICD-10-CM

## 2017-03-30 DIAGNOSIS — D120 Benign neoplasm of cecum: Secondary | ICD-10-CM

## 2017-03-30 MED ORDER — SODIUM CHLORIDE 0.9 % IV SOLN
500.0000 mL | Freq: Once | INTRAVENOUS | Status: DC
Start: 2017-03-30 — End: 2017-06-15

## 2017-03-30 NOTE — Patient Instructions (Signed)
Discharge instructions given. Handouts on polyps,diverticulosis and hemorrhoids. Resume previous medications. YOU HAD AN ENDOSCOPIC PROCEDURE TODAY AT THE [facility]:   Refer to the procedure report that was given to you for any specific questions about what was found during the examination.  If the procedure report does not answer your questions, please call your gastroenterologist to clarify.  If you requested that your care partner not be given the details of your procedure findings, then the procedure report has been included in a sealed envelope for you to review at your convenience later.  YOU SHOULD EXPECT: Some feelings of bloating in the abdomen. Passage of more gas than usual.  Walking can help get rid of the air that was put into your GI tract during the procedure and reduce the bloating. If you had a lower endoscopy (such as a colonoscopy or flexible sigmoidoscopy) you may notice spotting of blood in your stool or on the toilet paper. If you underwent a bowel prep for your procedure, you may not have a normal bowel movement for a few days.  Please Note:  You might notice some irritation and congestion in your nose or some drainage.  This is from the oxygen used during your procedure.  There is no need for concern and it should clear up in a day or so.  SYMPTOMS TO REPORT IMMEDIATELY:   Following lower endoscopy (colonoscopy or flexible sigmoidoscopy):  Excessive amounts of blood in the stool  Significant tenderness or worsening of abdominal pains  Swelling of the abdomen that is new, acute  Fever of 100F or higher   For urgent or emergent issues, a gastroenterologist can be reached at any hour by calling [phone].   DIET:  We do recommend a small meal at first, but then you may proceed to your regular diet.  Drink plenty of fluids but you should avoid alcoholic beverages for 24 hours.  ACTIVITY:  You should plan to take it easy for the rest of today and you  should NOT DRIVE or use heavy machinery until tomorrow (because of the sedation medicines used during the test).    FOLLOW UP: Our staff will call the number listed on your records the next business day following your procedure to check on you and address any questions or concerns that you may have regarding the information given to you following your procedure. If we do not reach you, we will leave a message.  However, if you are feeling well and you are not experiencing any problems, there is no need to return our call.  We will assume that you have returned to your regular daily activities without incident.  If any biopsies were taken you will be contacted by phone or by letter within the next 1-3 weeks.  Please call us at [phone] if you have not heard about the biopsies in 3 weeks.    SIGNATURES/CONFIDENTIALITY: You and/or your care partner have signed paperwork which will be entered into your electronic medical record.  These signatures attest to the fact that that the information above on your After Visit Summary has been reviewed and is understood.  Full responsibility of the confidentiality of this discharge information lies with you and/or your care-partner. 

## 2017-03-30 NOTE — Progress Notes (Signed)
Called to room to assist during endoscopic procedure.  Patient ID and intended procedure confirmed with present staff. Received instructions for my participation in the procedure from the performing physician.  

## 2017-03-30 NOTE — Progress Notes (Signed)
Report given to PACU, vss 

## 2017-03-30 NOTE — Progress Notes (Signed)
Pt's states no medical or surgical changes since previsit or office visit. 

## 2017-03-30 NOTE — Op Note (Signed)
[place] Patient Name: Cody Romero Procedure Date: 03/30/2017 1:52 PM MRN: [id number] Endoscopist: Remo Lipps P. Tionne Carelli MD, MD Age: 67 Referring MD:  Date of Birth: 06/25/1950 Gender: Male Account #: [id number] Procedure:                Colonoscopy Indications:              Surveillance: Personal history of adenomatous                            polyps on last colonoscopy > 5 years ago (2010) Medicines:                Monitored Anesthesia Care Procedure:                Pre-Anesthesia Assessment:                           - Prior to the procedure, a History and Physical                            was performed, and patient medications and                            allergies were reviewed. The patient's tolerance of                            previous anesthesia was also reviewed. The risks                            and benefits of the procedure and the sedation                            options and risks were discussed with the patient.                            All questions were answered, and informed consent                            was obtained. Prior Anticoagulants: The patient has                            taken no previous anticoagulant or antiplatelet                            agents. ASA Grade Assessment: II - A patient with                            mild systemic disease. After reviewing the risks                            and benefits, the patient was deemed in                            satisfactory condition to undergo the procedure.  After obtaining informed consent, the colonoscope                            was passed under direct vision. Throughout the                            procedure, the patient's blood pressure, pulse, and                            oxygen saturations were monitored continuously. The                            Colonoscope was introduced through the anus and                            advanced to  the the cecum, identified by                            appendiceal orifice and ileocecal valve. The                            colonoscopy was performed without difficulty. The                            patient tolerated the procedure well. The quality                            of the bowel preparation was adequate. The                            ileocecal valve, appendiceal orifice, and rectum                            were photographed. Scope In: 2:09:14 PM Scope Out: 2:32:28 PM Scope Withdrawal Time: 0 hours 19 minutes 39 seconds  Total Procedure Duration: 0 hours 23 minutes 14 seconds  Findings:                 The perianal and digital rectal examinations were                            normal.                           A 6 mm polyp was found in the cecum. The polyp was                            sessile. The polyp was removed with a cold snare.                            Resection and retrieval were complete.                           Three sessile polyps were found in the ascending  colon. The polyps were 3 to 4 mm in size. These                            polyps were removed with a cold snare. Resection                            and retrieval were complete.                           Two sessile polyps were found in the transverse                            colon. The polyps were 3 to 4 mm in size. These                            polyps were removed with a cold snare. Resection                            and retrieval were complete.                           A 4 mm polyp was found in the descending colon. The                            polyp was sessile. The polyp was removed with a                            cold snare. Resection and retrieval were complete.                           Many small-mouthed diverticula were found in the                            sigmoid colon, descending colon and transverse                            colon.                            Internal hemorrhoids were found during retroflexion.                           The exam was otherwise without abnormality. Complications:            No immediate complications. Estimated blood loss:                            Minimal. Estimated Blood Loss:     Estimated blood loss was minimal. Impression:               - One 6 mm polyp in the cecum, removed with a cold                            snare. Resected and retrieved.                           -  Three 3 to 4 mm polyps in the ascending colon,                            removed with a cold snare. Resected and retrieved.                           - Two 3 to 4 mm polyps in the transverse colon,                            removed with a cold snare. Resected and retrieved.                           - One 4 mm polyp in the descending colon, removed                            with a cold snare. Resected and retrieved.                           - Diverticulosis in the sigmoid colon, in the                            descending colon and in the transverse colon.                           - Internal hemorrhoids.                           - The examination was otherwise normal. Recommendation:           - Patient has a contact number available for                            emergencies. The signs and symptoms of potential                            delayed complications were discussed with the                            patient. Return to normal activities tomorrow.                            Written discharge instructions were provided to the                            patient.                           - Resume previous diet.                           - Continue present medications.                           - Await pathology results.                           -  Repeat colonoscopy is recommended for                            surveillance. The colonoscopy date will be                            determined after pathology  results from today's                            exam become available for review. Remo Lipps P. Kathy Wares MD, MD 03/30/2017 2:38:25 PM This report has been signed electronically.

## 2017-03-31 ENCOUNTER — Telehealth: Payer: Self-pay

## 2017-03-31 NOTE — Telephone Encounter (Signed)
  Follow up Call-  Call Cody Romero number 03/30/2017  Post procedure Call [name] phone  # [phone]  Permission to leave phone message Yes  Some recent data might be hidden     Patient questions:  Do you have a fever, pain , or abdominal swelling? No. Pain Score  0 *  Have you tolerated food without any problems? Yes.    Have you been able to return to your normal activities? Yes.    Do you have any questions about your discharge instructions: Diet   No. Medications  No. Follow up visit  No.  Do you have questions or concerns about your Care? No.  Actions: * If pain score is 4 or above:  No action needed, pain <4.

## 2017-04-06 ENCOUNTER — Encounter: Payer: Self-pay | Admitting: Gastroenterology

## 2017-04-22 ENCOUNTER — Other Ambulatory Visit: Payer: Self-pay | Admitting: General Surgery

## 2017-06-04 NOTE — Pre-Procedure Instructions (Signed)
Cody Romero  06/04/2017      [id number] [place], [place] [address] [place] [address] Phone: [phone] Fax: [phone]    Your procedure is scheduled on June 15, 2017.  Report to [facility] Admitting at 05:30 A.M.  Call this number if you have problems the morning of surgery:  [phone]  Continue all other medications as directed by your physician except for following these medication instructions before surgery.   Remember:  No food or liquids after midnight except for your Ensure drink.   **Please complete your PRE-SURGERY ENSURE that was provided before you leave your house the morning of surgery.  Please, if able, drink it in one setting. DO NOT SIP.**    Take these medicines the morning of surgery with A SIP OF WATER : Tylenol if needed  7 days prior to surgery STOP taking any Aspirin (unless otherwise instructed by your surgeon), Aleve, Naproxen, Ibuprofen, Motrin, Advil, Goody's, BC's, all herbal medications, fish oil, and all vitamins.  Follow your doctor's instructions regarding when to stop your Aspirin.     Do not wear jewelry.  Do not wear lotions, powders, or perfumes, or deodorant.  Do not shave 48 hours prior to surgery.  Men may shave face and neck.  Do not bring valuables to the hospital.   [facility] is not responsible for any belongings or valuables.  Contacts, dentures or bridgework may not be worn into surgery.  Leave your suitcase in the car.  After surgery it may be brought to your room.  For patients admitted to the hospital, discharge time will be determined by your treatment team.  Patients discharged the day of surgery will not be allowed to drive home.   Special instructions:   [facility]- Preparing For Surgery  Before surgery, you can play an important role. Because skin is not sterile, your skin needs to be as free of germs as possible. You can reduce the number of germs on  your skin by washing with CHG (chlorahexidine gluconate) Soap before surgery.  CHG is an antiseptic cleaner which kills germs and bonds with the skin to continue killing germs even after washing.    Oral Hygiene is also important to reduce your risk of infection.  Remember - BRUSH YOUR TEETH THE MORNING OF SURGERY WITH YOUR REGULAR TOOTHPASTE  Please do not use if you have an allergy to CHG or antibacterial soaps. If your skin becomes reddened/irritated stop using the CHG.  Do not shave (including legs and underarms) for at least 48 hours prior to first CHG shower. It is OK to shave your face.  Please follow these instructions carefully.   1. Shower the NIGHT BEFORE SURGERY and the MORNING OF SURGERY with CHG.   2. If you chose to wash your hair, wash your hair first as usual with your normal shampoo.  3. After you shampoo, rinse your hair and body thoroughly to remove the shampoo.  4. Use CHG as you would any other liquid soap. You can apply CHG directly to the skin and wash gently with a scrungie or a clean washcloth.   5. Apply the CHG Soap to your body ONLY FROM THE NECK DOWN.  Do not use on open wounds or open sores. Avoid contact with your eyes, ears, mouth and genitals (private parts). Wash Face and genitals (private parts)  with your normal soap.  6. Wash thoroughly, paying special attention to the area where  your surgery will be performed.  7. Thoroughly rinse your body with warm water from the neck down.  8. DO NOT shower/wash with your normal soap after using and rinsing off the CHG Soap.  9. Pat yourself dry with a CLEAN TOWEL.  10. Wear CLEAN PAJAMAS to bed the night before surgery, wear comfortable clothes the morning of surgery  11. Place CLEAN SHEETS on your bed the night of your first shower and DO NOT SLEEP WITH PETS.    Day of Surgery:  Do not apply any deodorants/lotions.  Please wear clean clothes to the hospital/surgery center.   Remember to brush your teeth  WITH YOUR REGULAR TOOTHPASTE.    Please read over the following fact sheets that you were given.

## 2017-06-07 ENCOUNTER — Encounter (HOSPITAL_COMMUNITY)
Admission: RE | Admit: 2017-06-07 | Discharge: 2017-06-07 | Disposition: A | Discharge status: Home / Self Care | Payer: 59 | Source: Ambulatory Visit | Attending: General Surgery | Admitting: General Surgery

## 2017-06-07 ENCOUNTER — Other Ambulatory Visit (HOSPITAL_COMMUNITY): Payer: 59

## 2017-06-07 ENCOUNTER — Encounter (HOSPITAL_COMMUNITY): Payer: Self-pay

## 2017-06-07 ENCOUNTER — Other Ambulatory Visit: Payer: Self-pay

## 2017-06-07 DIAGNOSIS — Z01818 Encounter for other preprocedural examination: Secondary | ICD-10-CM | POA: Diagnosis not present

## 2017-06-07 HISTORY — DX: Personal history of urinary calculi: Z87.442

## 2017-06-07 HISTORY — DX: Melanoma in situ, unspecified: D03.9

## 2017-06-07 LAB — BASIC METABOLIC PANEL
Anion gap: 8 (ref 5–15)
BUN: 17 mg/dL (ref 6–20)
CALCIUM: 8.9 mg/dL (ref 8.9–10.3)
CO2: 26 mmol/L (ref 22–32)
CREATININE: 1.07 mg/dL (ref 0.61–1.24)
Chloride: 109 mmol/L (ref 101–111)
GLUCOSE: 97 mg/dL (ref 65–99)
Potassium: 4.1 mmol/L (ref 3.5–5.1)
Sodium: 143 mmol/L (ref 135–145)

## 2017-06-07 LAB — CBC
HCT: 43.5 % (ref 39.0–52.0)
Hemoglobin: 14.4 g/dL (ref 13.0–17.0)
MCH: 32 pg (ref 26.0–34.0)
MCHC: 33.1 g/dL (ref 30.0–36.0)
MCV: 96.7 fL (ref 78.0–100.0)
PLATELETS: 251 10*3/uL (ref 150–400)
RBC: 4.5 MIL/uL (ref 4.22–5.81)
RDW: 13 % (ref 11.5–15.5)
WBC: 6.3 10*3/uL (ref 4.0–10.5)

## 2017-06-07 NOTE — Progress Notes (Signed)
PCP - Dr. Silvio Pate  Cardiologist - Denies  Chest x-ray - Denies  EKG - 06/07/17  Stress Test - Denies  ECHO - Denies  Cardiac Cath - Denies  Sleep Study - Denies CPAP - None  LABS- 06/07/17: CBC, BMP  ASA- LD- 6/6  Anesthesia- No  Pt denies having chest pain, sob, or fever at this time. All instructions explained to the pt, with a verbal understanding of the material. Pt agrees to go over the instructions while at home for a better understanding. The opportunity to ask questions was provided.

## 2017-06-07 NOTE — Pre-Procedure Instructions (Signed)
Helder Crisafulli  06/07/2017      [id number] [place], [place] [address] [place] [address] Phone: [phone] Fax: [phone]    Your procedure is scheduled on Tues., June 15, 2017 from 7:30AM-8:30AM  Report to [facility] Admitting Entrance "A" at 5:30AM  Call this number if you have problems the morning of surgery:  [phone]   Remember:  No food after midnight on June 10th  You may drink clear liquids until 3 hours (4:30) prior to surgery.  Clear liquids allowed are: Water, Juice (non-citric and without pulp), Carbonated beverages, Clear Tea, Black Coffee only, Plain Jell-O only, Gatorade and Plain Popsicles only. No Milk or milk products.  Please complete your PRE-SURGERY ENSURE that was given to by 4:30AM the morning of surgery.  Please, if able, drink it in one setting. DO NOT SIP.   Take these medicines the morning of surgery with A SIP OF WATER by 4:30AM:  If needed Tylenol for pain  Follow your doctors instructions regarding your Aspirin.  If no instructions were given by your doctor, then you will need to call the prescribing office office to get instructions.    7 days before surgery (6/4), stop taking all Other Aspirin Products, Vitamins, Fish oils, and Herbal medications. Also stop all NSAIDS i.e. Advil, Ibuprofen, Motrin, Aleve, Anaprox, Naproxen, BC and Goody Powders.    Do not wear jewelry.  Do not wear lotions, powders, colognes, or deodorant.  Do not shave 48 hours prior to surgery.  Men may shave face.  Do not bring valuables to the hospital.  [facility] is not responsible for any belongings or valuables.  Contacts, dentures or bridgework may not be worn into surgery.  Leave your suitcase in the car.  After surgery it may be brought to your room.  For patients admitted to the hospital, discharge time will be determined by your treatment team.  Patients discharged the day of surgery will not be allowed  to drive home.   Special instructions:  [place]- Preparing For Surgery  Before surgery, you can play an important role. Because skin is not sterile, your skin needs to be as free of germs as possible. You can reduce the number of germs on your skin by washing with CHG (chlorahexidine gluconate) Soap before surgery.  CHG is an antiseptic cleaner which kills germs and bonds with the skin to continue killing germs even after washing.    Oral Hygiene is also important to reduce your risk of infection.  Remember - BRUSH YOUR TEETH THE MORNING OF SURGERY WITH YOUR REGULAR TOOTHPASTE  Please do not use if you have an allergy to CHG or antibacterial soaps. If your skin becomes reddened/irritated stop using the CHG.  Do not shave (including legs and underarms) for at least 48 hours prior to first CHG shower. It is OK to shave your face.  Please follow these instructions carefully.   1. Shower the NIGHT BEFORE SURGERY and the MORNING OF SURGERY with CHG.   2. If you chose to wash your hair, wash your hair first as usual with your normal shampoo.  3. After you shampoo, rinse your hair and body thoroughly to remove the shampoo.  4. Use CHG as you would any other liquid soap. You can apply CHG directly to the skin and wash gently with a scrungie or a clean washcloth.   5. Apply the CHG Soap to your body ONLY FROM THE NECK DOWN.  Do not use on open wounds or open sores. Avoid contact with your eyes, ears, mouth and genitals (private parts). Wash Face and genitals (private parts)  with your normal soap.  6. Wash thoroughly, paying special attention to the area where your surgery will be performed.  7. Thoroughly rinse your body with warm water from the neck down.  8. DO NOT shower/wash with your normal soap after using and rinsing off the CHG Soap.  9. Pat yourself dry with a CLEAN TOWEL.  10. Wear CLEAN PAJAMAS to bed the night before surgery, wear comfortable clothes the morning of  surgery  11. Place CLEAN SHEETS on your bed the night of your first shower and DO NOT SLEEP WITH PETS.  Day of Surgery:  Do not apply any deodorants/lotions.  Please wear clean clothes to the hospital/surgery center.   Remember to brush your teeth WITH YOUR REGULAR TOOTHPASTE.  Please read over the following fact sheets that you were given. Pain Booklet, Coughing and Deep Breathing and Surgical Site Infection Prevention

## 2017-06-14 NOTE — Anesthesia Preprocedure Evaluation (Addendum)
Anesthesia Evaluation  Patient identified by MRN, date of birth, ID band Patient awake    Reviewed: Allergy & Precautions, NPO status , Patient's Chart, lab work & pertinent test results  Airway Mallampati: I       Dental no notable dental hx. (+) Teeth Intact   Pulmonary neg pulmonary ROS,    Pulmonary exam normal breath sounds clear to auscultation       Cardiovascular hypertension, Pt. on medications Normal cardiovascular exam Rhythm:Regular Rate:Normal     Neuro/Psych negative neurological ROS  negative psych ROS   GI/Hepatic negative GI ROS, Neg liver ROS,   Endo/Other  negative endocrine ROS  Renal/GU negative Renal ROS  negative genitourinary   Musculoskeletal negative musculoskeletal ROS (+)   Abdominal (+) + obese,   Peds  Hematology negative hematology ROS (+)   Anesthesia Other Findings   Reproductive/Obstetrics                            Anesthesia Physical Anesthesia Plan  ASA: II  Anesthesia Plan: General   Post-op Pain Management:    Induction: Intravenous  PONV Risk Score and Plan: 3 and Ondansetron and Dexamethasone  Airway Management Planned: Oral ETT  Additional Equipment:   Intra-op Plan:   Post-operative Plan: Extubation in OR  Informed Consent: I have reviewed the patients History and Physical, chart, labs and discussed the procedure including the risks, benefits and alternatives for the proposed anesthesia with the patient or authorized representative who has indicated his/her understanding and acceptance.   Dental advisory given  Plan Discussed with: CRNA and Surgeon  Anesthesia Plan Comments:        Anesthesia Quick Evaluation

## 2017-06-15 ENCOUNTER — Ambulatory Visit (HOSPITAL_COMMUNITY)
Admission: RE | Admit: 2017-06-15 | Discharge: 2017-06-15 | Disposition: A | Discharge status: Home / Self Care | Payer: 59 | Source: Ambulatory Visit | Attending: General Surgery | Admitting: General Surgery

## 2017-06-15 ENCOUNTER — Encounter (HOSPITAL_COMMUNITY)
Admission: RE | Disposition: A | Discharge status: Home / Self Care | Payer: Self-pay | Source: Ambulatory Visit | Attending: General Surgery

## 2017-06-15 ENCOUNTER — Ambulatory Visit (HOSPITAL_COMMUNITY): Payer: 59 | Admitting: Certified Registered Nurse Anesthetist

## 2017-06-15 ENCOUNTER — Encounter (HOSPITAL_COMMUNITY): Payer: Self-pay | Admitting: *Deleted

## 2017-06-15 DIAGNOSIS — Z79899 Other long term (current) drug therapy: Secondary | ICD-10-CM | POA: Diagnosis not present

## 2017-06-15 DIAGNOSIS — C4359 Malignant melanoma of other part of trunk: Secondary | ICD-10-CM | POA: Insufficient documentation

## 2017-06-15 DIAGNOSIS — Z7982 Long term (current) use of aspirin: Secondary | ICD-10-CM | POA: Insufficient documentation

## 2017-06-15 DIAGNOSIS — E785 Hyperlipidemia, unspecified: Secondary | ICD-10-CM | POA: Diagnosis not present

## 2017-06-15 DIAGNOSIS — M109 Gout, unspecified: Secondary | ICD-10-CM | POA: Diagnosis not present

## 2017-06-15 DIAGNOSIS — Z86718 Personal history of other venous thrombosis and embolism: Secondary | ICD-10-CM | POA: Insufficient documentation

## 2017-06-15 DIAGNOSIS — I10 Essential (primary) hypertension: Secondary | ICD-10-CM | POA: Diagnosis not present

## 2017-06-15 DIAGNOSIS — N400 Enlarged prostate without lower urinary tract symptoms: Secondary | ICD-10-CM | POA: Diagnosis not present

## 2017-06-15 HISTORY — PX: MELANOMA EXCISION: SHX5266

## 2017-06-15 SURGERY — EXCISION, MELANOMA
Anesthesia: General | Site: Back

## 2017-06-15 MED ORDER — SODIUM CHLORIDE 0.9 % IJ SOLN
INTRAMUSCULAR | Status: AC
  Filled 2017-06-15: qty 10

## 2017-06-15 MED ORDER — LIDOCAINE 2% (20 MG/ML) 5 ML SYRINGE
INTRAMUSCULAR | Status: DC | PRN
  Administered 2017-06-15: 100 mg via INTRAVENOUS

## 2017-06-15 MED ORDER — PROPOFOL 10 MG/ML IV BOLUS
INTRAVENOUS | Status: DC | PRN
  Administered 2017-06-15: 200 mg via INTRAVENOUS

## 2017-06-15 MED ORDER — EPHEDRINE SULFATE 50 MG/ML IJ SOLN
INTRAMUSCULAR | Status: DC | PRN
  Administered 2017-06-15 (×2): 5 mg via INTRAVENOUS
  Administered 2017-06-15: 10 mg via INTRAVENOUS
  Administered 2017-06-15: 5 mg via INTRAVENOUS
  Administered 2017-06-15: 10 mg via INTRAVENOUS
  Administered 2017-06-15 (×3): 5 mg via INTRAVENOUS

## 2017-06-15 MED ORDER — ONDANSETRON HCL 4 MG/2ML IJ SOLN: 4.0000 mg | Freq: Once | INTRAMUSCULAR | Status: DC | PRN

## 2017-06-15 MED ORDER — LIDOCAINE HCL 1 % IJ SOLN
INTRAMUSCULAR | Status: DC | PRN
  Administered 2017-06-15: 09:00:00 via INTRAMUSCULAR

## 2017-06-15 MED ORDER — LACTATED RINGERS IV SOLN
INTRAVENOUS | Status: DC | PRN
  Administered 2017-06-15 (×2): via INTRAVENOUS

## 2017-06-15 MED ORDER — DEXAMETHASONE SODIUM PHOSPHATE 10 MG/ML IJ SOLN
INTRAMUSCULAR | Status: AC
  Filled 2017-06-15: qty 1

## 2017-06-15 MED ORDER — FENTANYL CITRATE (PF) 250 MCG/5ML IJ SOLN
INTRAMUSCULAR | Status: DC | PRN
  Administered 2017-06-15: 100 ug via INTRAVENOUS

## 2017-06-15 MED ORDER — ROCURONIUM BROMIDE 10 MG/ML (PF) SYRINGE
PREFILLED_SYRINGE | INTRAVENOUS | Status: AC
  Filled 2017-06-15: qty 15

## 2017-06-15 MED ORDER — OXYCODONE HCL 5 MG/5ML PO SOLN: 5.0000 mg | Freq: Once | ORAL | Status: DC | PRN

## 2017-06-15 MED ORDER — ACETAMINOPHEN 500 MG PO TABS
1000.0000 mg | ORAL_TABLET | ORAL | Status: AC
  Administered 2017-06-15: 1000 mg via ORAL
  Filled 2017-06-15: qty 2

## 2017-06-15 MED ORDER — ACETAMINOPHEN 325 MG PO TABS: 325.0000 mg | ORAL_TABLET | ORAL | Status: DC | PRN

## 2017-06-15 MED ORDER — LIDOCAINE HCL 1 % IJ SOLN
INTRAMUSCULAR | Status: AC
  Filled 2017-06-15: qty 20

## 2017-06-15 MED ORDER — CHLORHEXIDINE GLUCONATE CLOTH 2 % EX PADS: 6.0000 | MEDICATED_PAD | Freq: Once | CUTANEOUS | Status: DC

## 2017-06-15 MED ORDER — SUGAMMADEX SODIUM 200 MG/2ML IV SOLN
INTRAVENOUS | Status: DC | PRN
  Administered 2017-06-15: 250 mg via INTRAVENOUS

## 2017-06-15 MED ORDER — MIDAZOLAM HCL 2 MG/2ML IJ SOLN
INTRAMUSCULAR | Status: AC
  Filled 2017-06-15: qty 2

## 2017-06-15 MED ORDER — 0.9 % SODIUM CHLORIDE (POUR BTL) OPTIME
TOPICAL | Status: DC | PRN
  Administered 2017-06-15: 1000 mL

## 2017-06-15 MED ORDER — SUGAMMADEX SODIUM 200 MG/2ML IV SOLN
INTRAVENOUS | Status: AC
  Filled 2017-06-15: qty 2

## 2017-06-15 MED ORDER — GABAPENTIN 300 MG PO CAPS
300.0000 mg | ORAL_CAPSULE | ORAL | Status: AC
  Administered 2017-06-15: 300 mg via ORAL
  Filled 2017-06-15: qty 1

## 2017-06-15 MED ORDER — METHYLENE BLUE 0.5 % INJ SOLN
INTRAVENOUS | Status: AC
  Filled 2017-06-15: qty 10

## 2017-06-15 MED ORDER — BUPIVACAINE-EPINEPHRINE (PF) 0.25% -1:200000 IJ SOLN
INTRAMUSCULAR | Status: AC
  Filled 2017-06-15: qty 30

## 2017-06-15 MED ORDER — MIDAZOLAM HCL 2 MG/2ML IJ SOLN
INTRAMUSCULAR | Status: DC | PRN
  Administered 2017-06-15: 2 mg via INTRAVENOUS

## 2017-06-15 MED ORDER — ACETAMINOPHEN 160 MG/5ML PO SOLN: 325.0000 mg | ORAL | Status: DC | PRN

## 2017-06-15 MED ORDER — PHENYLEPHRINE 40 MCG/ML (10ML) SYRINGE FOR IV PUSH (FOR BLOOD PRESSURE SUPPORT)
PREFILLED_SYRINGE | INTRAVENOUS | Status: DC | PRN
  Administered 2017-06-15 (×2): 40 ug via INTRAVENOUS
  Administered 2017-06-15 (×2): 120 ug via INTRAVENOUS
  Administered 2017-06-15 (×3): 40 ug via INTRAVENOUS

## 2017-06-15 MED ORDER — DEXAMETHASONE SODIUM PHOSPHATE 10 MG/ML IJ SOLN
INTRAMUSCULAR | Status: DC | PRN
  Administered 2017-06-15: 10 mg via INTRAVENOUS

## 2017-06-15 MED ORDER — FENTANYL CITRATE (PF) 100 MCG/2ML IJ SOLN: 25.0000 ug | INTRAMUSCULAR | Status: DC | PRN

## 2017-06-15 MED ORDER — ONDANSETRON HCL 4 MG/2ML IJ SOLN
INTRAMUSCULAR | Status: DC | PRN
  Administered 2017-06-15: 4 mg via INTRAVENOUS

## 2017-06-15 MED ORDER — PROPOFOL 10 MG/ML IV BOLUS
INTRAVENOUS | Status: AC
  Filled 2017-06-15: qty 20

## 2017-06-15 MED ORDER — CIPROFLOXACIN IN D5W 400 MG/200ML IV SOLN
400.0000 mg | INTRAVENOUS | Status: AC
  Administered 2017-06-15: 400 mg via INTRAVENOUS
  Filled 2017-06-15: qty 200

## 2017-06-15 MED ORDER — OXYCODONE HCL 5 MG PO TABS: 5.0000 mg | ORAL_TABLET | Freq: Four times a day (QID) | ORAL | 0 refills | Status: DC | PRN

## 2017-06-15 MED ORDER — ONDANSETRON HCL 4 MG/2ML IJ SOLN
INTRAMUSCULAR | Status: AC
  Filled 2017-06-15: qty 2

## 2017-06-15 MED ORDER — MEPERIDINE HCL 50 MG/ML IJ SOLN: 6.2500 mg | INTRAMUSCULAR | Status: DC | PRN

## 2017-06-15 MED ORDER — SUGAMMADEX SODIUM 500 MG/5ML IV SOLN
INTRAVENOUS | Status: AC
  Filled 2017-06-15: qty 5

## 2017-06-15 MED ORDER — ROCURONIUM BROMIDE 10 MG/ML (PF) SYRINGE
PREFILLED_SYRINGE | INTRAVENOUS | Status: DC | PRN
  Administered 2017-06-15: 50 mg via INTRAVENOUS

## 2017-06-15 MED ORDER — OXYCODONE HCL 5 MG PO TABS: 5.0000 mg | ORAL_TABLET | Freq: Once | ORAL | Status: DC | PRN

## 2017-06-15 MED ORDER — PHENYLEPHRINE 40 MCG/ML (10ML) SYRINGE FOR IV PUSH (FOR BLOOD PRESSURE SUPPORT)
PREFILLED_SYRINGE | INTRAVENOUS | Status: AC
  Filled 2017-06-15: qty 20

## 2017-06-15 MED ORDER — LIDOCAINE 2% (20 MG/ML) 5 ML SYRINGE
INTRAMUSCULAR | Status: AC
  Filled 2017-06-15: qty 10

## 2017-06-15 MED ORDER — KETOROLAC TROMETHAMINE 30 MG/ML IJ SOLN: 30.0000 mg | Freq: Once | INTRAMUSCULAR | Status: DC | PRN

## 2017-06-15 MED ORDER — FENTANYL CITRATE (PF) 250 MCG/5ML IJ SOLN
INTRAMUSCULAR | Status: AC
  Filled 2017-06-15: qty 5

## 2017-06-15 MED ORDER — EPHEDRINE SULFATE 50 MG/ML IJ SOLN
INTRAMUSCULAR | Status: AC
  Filled 2017-06-15: qty 2

## 2017-06-15 SURGICAL SUPPLY — 41 items
BENZOIN TINCTURE PRP APPL 2/3 (GAUZE/BANDAGES/DRESSINGS) ×2 IMPLANT
BLADE SURG 10 STRL SS (BLADE) ×2 IMPLANT
CANISTER SUCT 3000ML PPV (MISCELLANEOUS) ×2 IMPLANT
CHLORAPREP W/TINT 26ML (MISCELLANEOUS) ×2 IMPLANT
CLSR STERI-STRIP ANTIMIC 1/2X4 (GAUZE/BANDAGES/DRESSINGS) ×2 IMPLANT
COVER SURGICAL LIGHT HANDLE (MISCELLANEOUS) ×2 IMPLANT
DRAPE UTILITY XL STRL (DRAPES) ×4 IMPLANT
DRSG TEGADERM 4X4.75 (GAUZE/BANDAGES/DRESSINGS) ×2 IMPLANT
ELECT CAUTERY BLADE 6.4 (BLADE) ×2 IMPLANT
ELECT REM PT RETURN 9FT ADLT (ELECTROSURGICAL) ×2
ELECTRODE REM PT RTRN 9FT ADLT (ELECTROSURGICAL) ×1 IMPLANT
GAUZE SPONGE 4X4 12PLY STRL (GAUZE/BANDAGES/DRESSINGS) ×2 IMPLANT
GAUZE SPONGE 4X4 16PLY XRAY LF (GAUZE/BANDAGES/DRESSINGS) ×2 IMPLANT
GLOVE BIO SURGEON STRL SZ 6 (GLOVE) ×2 IMPLANT
GLOVE INDICATOR 6.5 STRL GRN (GLOVE) ×2 IMPLANT
GOWN STRL REUS W/ TWL LRG LVL3 (GOWN DISPOSABLE) ×1 IMPLANT
GOWN STRL REUS W/TWL 2XL LVL3 (GOWN DISPOSABLE) ×2 IMPLANT
GOWN STRL REUS W/TWL LRG LVL3 (GOWN DISPOSABLE) ×1
KIT BASIN OR (CUSTOM PROCEDURE TRAY) ×2 IMPLANT
KIT TURNOVER KIT B (KITS) ×2 IMPLANT
NEEDLE FILTER BLUNT 18X 1/2SAF (NEEDLE) ×1
NEEDLE FILTER BLUNT 18X1 1/2 (NEEDLE) ×1 IMPLANT
NEEDLE HYPO 25GX1X1/2 BEV (NEEDLE) ×4 IMPLANT
NS IRRIG 1000ML POUR BTL (IV SOLUTION) ×2 IMPLANT
PACK SURGICAL SETUP 50X90 (CUSTOM PROCEDURE TRAY) ×2 IMPLANT
PAD ARMBOARD 7.5X6 YLW CONV (MISCELLANEOUS) ×4 IMPLANT
PENCIL BUTTON HOLSTER BLD 10FT (ELECTRODE) ×2 IMPLANT
SPECIMEN JAR SMALL (MISCELLANEOUS) ×2 IMPLANT
STRIP CLOSURE SKIN 1/2X4 (GAUZE/BANDAGES/DRESSINGS) IMPLANT
SUT ETHILON 2 0 FS 18 (SUTURE) ×4 IMPLANT
SUT MON AB 4-0 PC3 18 (SUTURE) ×4 IMPLANT
SUT VIC AB 2-0 SH 27 (SUTURE) ×2
SUT VIC AB 2-0 SH 27XBRD (SUTURE) ×2 IMPLANT
SUT VIC AB 3-0 SH 27 (SUTURE) ×1
SUT VIC AB 3-0 SH 27X BRD (SUTURE) ×1 IMPLANT
SUT VIC AB 3-0 SH 8-18 (SUTURE) ×2 IMPLANT
SYR BULB 3OZ (MISCELLANEOUS) ×2 IMPLANT
SYR CONTROL 10ML LL (SYRINGE) ×4 IMPLANT
TOWEL OR 17X24 6PK STRL BLUE (TOWEL DISPOSABLE) ×2 IMPLANT
TUBE CONNECTING 12X1/4 (SUCTIONS) IMPLANT
YANKAUER SUCT BULB TIP NO VENT (SUCTIONS) IMPLANT
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
## 2017-06-15 NOTE — Transfer of Care (Signed)
Immediate Anesthesia Transfer of Care Note  Patient: Cody Romero  Procedure(s) Performed: WIDE LOCAL EXCISION WITH ADVANCEMENT FLAP CLOSURE BACK MELANOMA ERAS PATHWAY (N/A Back)  Patient Location: PACU  Anesthesia Type:General  Level of Consciousness: awake, alert  and oriented  Airway & Oxygen Therapy: Patient Spontanous Breathing  Post-op Assessment: Report given to RN and Post -op Vital signs reviewed and stable  Post vital signs: Reviewed and stable  Last Vitals:  Vitals Value Taken Time  BP    Temp    Pulse 84 06/15/2017  9:11 AM  Resp 18 06/15/2017  9:11 AM  SpO2 97 % 06/15/2017  9:11 AM  Vitals shown include unvalidated device data.  Last Pain:  Vitals:   06/15/17 0621  TempSrc: Oral  PainSc:          Complications: No apparent anesthesia complications

## 2017-06-15 NOTE — Anesthesia Procedure Notes (Addendum)
Procedure Name: Intubation Date/Time: 06/15/2017 7:50 AM Performed by: Bryson Corona, CRNA Pre-anesthesia Checklist: Patient identified, Emergency Drugs available, Suction available and Patient being monitored Patient Re-evaluated:Patient Re-evaluated prior to induction Oxygen Delivery Method: Circle System Utilized Preoxygenation: Pre-oxygenation with 100% oxygen Induction Type: IV induction Ventilation: Two handed mask ventilation required and Oral airway inserted - appropriate to patient size Laryngoscope Size: Mac and 4 Grade View: Grade II Tube type: Oral Tube size: 7.0 mm Number of attempts: 1 Airway Equipment and Method: Stylet and Oral airway Placement Confirmation: ETT inserted through vocal cords under direct vision,  positive ETCO2 and breath sounds checked- equal and bilateral Secured at: 22 cm Tube secured with: Tape Dental Injury: Teeth and Oropharynx as per pre-operative assessment

## 2017-06-15 NOTE — Discharge Instructions (Addendum)
[facility] Office Phone Number [phone]   POST OP INSTRUCTIONS  Always review your discharge instruction sheet given to you by the facility where your surgery was performed.  IF YOU HAVE DISABILITY OR FAMILY LEAVE FORMS, YOU MUST BRING THEM TO THE OFFICE FOR PROCESSING.  DO NOT GIVE THEM TO YOUR DOCTOR.  1. A prescription for pain medication may be given to you upon discharge.  Take your pain medication as prescribed, if needed.  If narcotic pain medicine is not needed, then you may take acetaminophen (Tylenol) or ibuprofen (Advil) as needed. 2. Take your usually prescribed medications unless otherwise directed 3. If you need a refill on your pain medication, please contact your pharmacy.  They will contact our office to request authorization.  Prescriptions will not be filled after 5pm or on week-ends. 4. You should eat very light the first 24 hours after surgery, such as soup, crackers, pudding, etc.  Resume your normal diet the day after surgery 5. It is common to experience some constipation if taking pain medication after surgery.  Increasing fluid intake and taking a stool softener will usually help or prevent this problem from occurring.  A mild laxative (Milk of Magnesia or Miralax) should be taken according to package directions if there are no bowel movements after 48 hours. 6. You may shower in 48 hours.  The surgical glue will flake off in 2-3 weeks.   7. ACTIVITIES:  No strenuous activity or heavy lifting for 1 week.   a. You may drive when you no longer are taking prescription pain medication, you can comfortably wear a seatbelt, and you can safely maneuver your car and apply brakes. b. RETURN TO WORK:  __________as tolerated as long as NO lifting over 10 pounds for 1-2 weeks._______________ Dennis Bast should see your doctor in the office for a follow-up appointment approximately three-four weeks after your surgery.    WHEN TO CALL YOUR DOCTOR: 1. Fever over  101.0 2. Nausea and/or vomiting. 3. Extreme swelling or bruising. 4. Continued bleeding from incision. 5. Increased pain, redness, or drainage from the incision.  The clinic staff is available to answer your questions during regular business hours.  Please dont hesitate to call and ask to speak to one of the nurses for clinical concerns.  If you have a medical emergency, go to the nearest emergency room or call 911.  A surgeon from [facility] is always on call at the hospital.  For further questions, please visit [email or web address]

## 2017-06-15 NOTE — Op Note (Signed)
PRE-OPERATIVE DIAGNOSIS: cTis right back melanoma  POST-OPERATIVE DIAGNOSIS:  Same  PROCEDURE:  Procedure(s): Wide local excision 5 mm margins, advancement flap closure for defect 2 cm x 7 cm  SURGEON:  Surgeon(s): Stark Klein, MD  ASSIST: Judyann Munson, RNFA  ANESTHESIA:   local and general  DRAINS: none   LOCAL MEDICATIONS USED:  MARCAINE    and XYLOCAINE   SPECIMEN:  Source of Specimen:  Right back melanoma  FINDINGS:  No clinically apparent residual disease  DISPOSITION OF SPECIMEN:  PATHOLOGY  COUNTS:  YES  PLAN OF CARE: Discharge to home after PACU  PATIENT DISPOSITION:  PACU - hemodynamically stable.    PROCEDURE:   Pt was identified in the holding area, taken to the OR, and placed supine on the OR table.  General anesthesia was induced.  Time out was performed according to the surgical safety checklist.  When all was correct, we continued.    The patient was placed into the left lateral decubitus position on a beanbag with appropriate padding.  The upper back was prepped and draped in sterile fashion.  The melanoma was identified and 5 mm margins were marked out.  An ellipse was made incorporating the circle.  Local was administered under the melanoma and the adjacent tissue.  A #10 blade was used to incise the skin around the melanoma.  The cautery was used to take the dissection down to the fascia.  The skin was marked in situ with silk suture.  The cautery was used to take the specimen off the fascia, and it was passed off the table.    Skin hooks were used to elevate the edges of the incision and the skin was freed up in all directions to create the advancement flaps.  This was pulled together, advancing the flaps to cover the defect in a transverse orientation.  Deep interrupted 2-0 vicryl sutures were placed to relieve tension.  The skin was then reapproximated with 3-0 interrupted vicryl deep dermal sutures and 4-0 monocryl running subcuticular sutures.  Three  2-0 nylon horizontal mattress sutures were placed as well.  The wound was dressed with Benzoin, steristrips, gauze, and tegaderm.    Needle, sponge, and instrument counts were correct.  The patient was awakened from anesthesia and taken to the PACU in stable condition.

## 2017-06-16 ENCOUNTER — Encounter (HOSPITAL_COMMUNITY): Payer: Self-pay | Admitting: General Surgery

## 2017-06-16 NOTE — Anesthesia Postprocedure Evaluation (Signed)
Anesthesia Post Note  Patient: Cody Romero  Procedure(s) Performed: WIDE LOCAL EXCISION WITH ADVANCEMENT FLAP CLOSURE BACK MELANOMA ERAS PATHWAY (N/A Back)     Patient location during evaluation: PACU Anesthesia Type: General Level of consciousness: awake Pain management: pain level controlled Vital Signs Assessment: post-procedure vital signs reviewed and stable Respiratory status: spontaneous breathing Cardiovascular status: stable Postop Assessment: no apparent nausea or vomiting Anesthetic complications: no    Last Vitals:  Vitals:   06/15/17 0932 06/15/17 0945  BP: 124/63 122/78  Pulse: 77   Resp: 16 16  Temp: (!) 36.3 C   SpO2: 99% 99%    Last Pain:  Vitals:   06/15/17 0945  TempSrc:   PainSc: 0-No pain   Pain Goal:                 Mitzie Marlar JR,JOHN Berlie Persky

## 2017-06-17 NOTE — Progress Notes (Signed)
Please let patient know no residual melanoma.

## 2017-06-28 ENCOUNTER — Ambulatory Visit: Payer: Self-pay

## 2017-06-28 NOTE — Telephone Encounter (Signed)
Pt. Reports he had surgery 06/15/17 with [facility] to his back for removal of melanoma. States he saw them this past Thursday and his incision was draining. He was put on an antibiotic. He is concerned it's more red today and still draining. Instructed pt. To contact his surgeon for evaluation. Verbalizes understanding.

## 2017-09-17 ENCOUNTER — Encounter: Payer: 59 | Admitting: Internal Medicine

## 2017-09-20 ENCOUNTER — Encounter: Payer: Self-pay | Admitting: Internal Medicine

## 2017-09-20 ENCOUNTER — Ambulatory Visit (INDEPENDENT_AMBULATORY_CARE_PROVIDER_SITE_OTHER): Payer: 59 | Admitting: Internal Medicine

## 2017-09-20 VITALS — BP 110/80 | HR 67 | Temp 98.1°F | Ht 69.75 in | Wt 261.0 lb

## 2017-09-20 DIAGNOSIS — Z0000 Encounter for general adult medical examination without abnormal findings: Secondary | ICD-10-CM

## 2017-09-20 DIAGNOSIS — R42 Dizziness and giddiness: Secondary | ICD-10-CM

## 2017-09-20 DIAGNOSIS — Z23 Encounter for immunization: Secondary | ICD-10-CM | POA: Diagnosis not present

## 2017-09-20 DIAGNOSIS — I10 Essential (primary) hypertension: Secondary | ICD-10-CM | POA: Diagnosis not present

## 2017-09-20 MED ORDER — LISINOPRIL 20 MG PO TABS: 20.0000 mg | ORAL_TABLET | Freq: Every day | ORAL | 3 refills | Status: DC

## 2017-09-20 MED ORDER — MECLIZINE HCL 25 MG PO TABS: 25.0000 mg | ORAL_TABLET | Freq: Three times a day (TID) | ORAL | 3 refills | Status: DC | PRN

## 2017-09-20 NOTE — Assessment & Plan Note (Signed)
Seems vestibular but short term ?hypoglycemia? Will try prn meclizine

## 2017-09-20 NOTE — Patient Instructions (Signed)
DASH Eating Plan DASH stands for "Dietary Approaches to Stop Hypertension." The DASH eating plan is a healthy eating plan that has been shown to reduce high blood pressure (hypertension). It may also reduce your risk for type 2 diabetes, heart disease, and stroke. The DASH eating plan may also help with weight loss. What are tips for following this plan? General guidelines  Avoid eating more than 2,300 mg (milligrams) of salt (sodium) a day. If you have hypertension, you may need to reduce your sodium intake to 1,500 mg a day.  Limit alcohol intake to no more than 1 drink a day for nonpregnant women and 2 drinks a day for men. One drink equals 12 oz of beer, 5 oz of wine, or 1 oz of hard liquor.  Work with your health care provider to maintain a healthy body weight or to lose weight. Ask what an ideal weight is for you.  Get at least 30 minutes of exercise that causes your heart to beat faster (aerobic exercise) most days of the week. Activities may include walking, swimming, or biking.  Work with your health care provider or diet and nutrition specialist (dietitian) to adjust your eating plan to your individual calorie needs. Reading food labels  Check food labels for the amount of sodium per serving. Choose foods with less than 5 percent of the Daily Value of sodium. Generally, foods with less than 300 mg of sodium per serving fit into this eating plan.  To find whole grains, look for the word "whole" as the first word in the ingredient list. Shopping  Buy products labeled as "low-sodium" or "no salt added."  Buy fresh foods. Avoid canned foods and premade or frozen meals. Cooking  Avoid adding salt when cooking. Use salt-free seasonings or herbs instead of table salt or sea salt. Check with your health care provider or pharmacist before using salt substitutes.  Do not fry foods. Cook foods using healthy methods such as baking, boiling, grilling, and broiling instead.  Cook with  heart-healthy oils, such as olive, canola, soybean, or sunflower oil. Meal planning   Eat a balanced diet that includes: ? 5 or more servings of fruits and vegetables each day. At each meal, try to fill half of your plate with fruits and vegetables. ? Up to 6-8 servings of whole grains each day. ? Less than 6 oz of lean meat, poultry, or fish each day. A 3-oz serving of meat is about the same size as a deck of cards. One egg equals 1 oz. ? 2 servings of low-fat dairy each day. ? A serving of nuts, seeds, or beans 5 times each week. ? Heart-healthy fats. Healthy fats called Omega-3 fatty acids are found in foods such as flaxseeds and coldwater fish, like sardines, salmon, and mackerel.  Limit how much you eat of the following: ? Canned or prepackaged foods. ? Food that is high in trans fat, such as fried foods. ? Food that is high in saturated fat, such as fatty meat. ? Sweets, desserts, sugary drinks, and other foods with added sugar. ? Full-fat dairy products.  Do not salt foods before eating.  Try to eat at least 2 vegetarian meals each week.  Eat more home-cooked food and less restaurant, buffet, and fast food.  When eating at a restaurant, ask that your food be prepared with less salt or no salt, if possible. What foods are recommended? The items listed may not be a complete list. Talk with your dietitian about what   dietary choices are best for you. Grains Whole-grain or whole-wheat bread. Whole-grain or whole-wheat pasta. Brown rice. Oatmeal. Quinoa. Bulgur. Whole-grain and low-sodium cereals. Pita bread. Low-fat, low-sodium crackers. Whole-wheat flour tortillas. Vegetables Fresh or frozen vegetables (raw, steamed, roasted, or grilled). Low-sodium or reduced-sodium tomato and vegetable juice. Low-sodium or reduced-sodium tomato sauce and tomato paste. Low-sodium or reduced-sodium canned vegetables. Fruits All fresh, dried, or frozen fruit. Canned fruit in natural juice (without  added sugar). Meat and other protein foods Skinless chicken or turkey. Ground chicken or turkey. Pork with fat trimmed off. Fish and seafood. Egg whites. Dried beans, peas, or lentils. Unsalted nuts, nut butters, and seeds. Unsalted canned beans. Lean cuts of beef with fat trimmed off. Low-sodium, lean deli meat. Dairy Low-fat (1%) or fat-free (skim) milk. Fat-free, low-fat, or reduced-fat cheeses. Nonfat, low-sodium ricotta or cottage cheese. Low-fat or nonfat yogurt. Low-fat, low-sodium cheese. Fats and oils Soft margarine without trans fats. Vegetable oil. Low-fat, reduced-fat, or light mayonnaise and salad dressings (reduced-sodium). Canola, safflower, olive, soybean, and sunflower oils. Avocado. Seasoning and other foods Herbs. Spices. Seasoning mixes without salt. Unsalted popcorn and pretzels. Fat-free sweets. What foods are not recommended? The items listed may not be a complete list. Talk with your dietitian about what dietary choices are best for you. Grains Baked goods made with fat, such as croissants, muffins, or some breads. Dry pasta or rice meal packs. Vegetables Creamed or fried vegetables. Vegetables in a cheese sauce. Regular canned vegetables (not low-sodium or reduced-sodium). Regular canned tomato sauce and paste (not low-sodium or reduced-sodium). Regular tomato and vegetable juice (not low-sodium or reduced-sodium). Pickles. Olives. Fruits Canned fruit in a light or heavy syrup. Fried fruit. Fruit in cream or butter sauce. Meat and other protein foods Fatty cuts of meat. Ribs. Fried meat. Bacon. Sausage. Bologna and other processed lunch meats. Salami. Fatback. Hotdogs. Bratwurst. Salted nuts and seeds. Canned beans with added salt. Canned or smoked fish. Whole eggs or egg yolks. Chicken or turkey with skin. Dairy Whole or 2% milk, cream, and half-and-half. Whole or full-fat cream cheese. Whole-fat or sweetened yogurt. Full-fat cheese. Nondairy creamers. Whipped toppings.  Processed cheese and cheese spreads. Fats and oils Butter. Stick margarine. Lard. Shortening. Ghee. Bacon fat. Tropical oils, such as coconut, palm kernel, or palm oil. Seasoning and other foods Salted popcorn and pretzels. Onion salt, garlic salt, seasoned salt, table salt, and sea salt. Worcestershire sauce. Tartar sauce. Barbecue sauce. Teriyaki sauce. Soy sauce, including reduced-sodium. Steak sauce. Canned and packaged gravies. Fish sauce. Oyster sauce. Cocktail sauce. Horseradish that you find on the shelf. Ketchup. Mustard. Meat flavorings and tenderizers. Bouillon cubes. Hot sauce and Tabasco sauce. Premade or packaged marinades. Premade or packaged taco seasonings. Relishes. Regular salad dressings. Where to find more information:  National Heart, Lung, and Blood Institute: www.nhlbi.nih.gov  American Heart Association: www.heart.org Summary  The DASH eating plan is a healthy eating plan that has been shown to reduce high blood pressure (hypertension). It may also reduce your risk for type 2 diabetes, heart disease, and stroke.  With the DASH eating plan, you should limit salt (sodium) intake to 2,300 mg a day. If you have hypertension, you may need to reduce your sodium intake to 1,500 mg a day.  When on the DASH eating plan, aim to eat more fresh fruits and vegetables, whole grains, lean proteins, low-fat dairy, and heart-healthy fats.  Work with your health care provider or diet and nutrition specialist (dietitian) to adjust your eating plan to your individual   calorie needs. This information is not intended to replace advice given to you by your health care provider. Make sure you discuss any questions you have with your health care provider. Document Released: 12/11/2010 Document Revised: 12/16/2015 Document Reviewed: 12/16/2015 Elsevier Interactive Patient Education  2018 Elsevier Inc.  

## 2017-09-20 NOTE — Assessment & Plan Note (Signed)
BP Readings from Last 3 Encounters:  09/20/17 110/80  06/15/17 122/78  06/07/17 (!) 143/85   Good control Recent labs okay No changes
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
## 2017-09-20 NOTE — Addendum Note (Signed)
Addended by: Pilar Grammes on: 09/20/2017 12:24 PM   Modules accepted: Orders

## 2017-09-20 NOTE — Assessment & Plan Note (Signed)
Healthy but out of shape (despite his exercise) Discussed DASH, stopping sweet tea, etc Consider PSA next year Colon due again in 2024 Pneumovax, flu vaccines today

## 2017-10-16 ENCOUNTER — Other Ambulatory Visit: Payer: Self-pay | Admitting: Internal Medicine

## 2017-10-27 ENCOUNTER — Other Ambulatory Visit: Payer: Self-pay | Admitting: Internal Medicine

## 2017-10-27 NOTE — Telephone Encounter (Signed)
Copied from [place] [id number]. Topic: General - Other >> Oct 27, 2017  2:46 PM Lennox Solders wrote: Reason for CRM: pt is calling and needs refill on lisinopril. Pt saw dr Silvio Pate on 09-20-2017. [facility] [place] [facility] [address]

## 2017-10-27 NOTE — Telephone Encounter (Signed)
Patient is requesting refill- please send to [facility] pool for review

## 2017-10-28 MED ORDER — LISINOPRIL 20 MG PO TABS: 20.0000 mg | ORAL_TABLET | Freq: Every day | ORAL | 3 refills | Status: DC

## 2017-10-28 NOTE — Telephone Encounter (Signed)
I spoke with Cody Romero at [facility] and they had computer problems recently and cannot find lisinopril filled on 09/20/17.resent electronically lisinopril 20 mg # 90 x 3. Pt voiced understanding and will pick up med.

## 2018-02-21 ENCOUNTER — Other Ambulatory Visit: Payer: Self-pay | Admitting: General Surgery

## 2018-04-22 ENCOUNTER — Encounter: Payer: Self-pay | Admitting: Internal Medicine

## 2018-04-22 ENCOUNTER — Ambulatory Visit (INDEPENDENT_AMBULATORY_CARE_PROVIDER_SITE_OTHER): Payer: 59 | Admitting: Internal Medicine

## 2018-04-22 VITALS — Temp 98.0°F | Resp 12 | Ht 69.75 in | Wt 256.0 lb

## 2018-04-22 DIAGNOSIS — R05 Cough: Secondary | ICD-10-CM

## 2018-04-22 DIAGNOSIS — M940 Chondrocostal junction syndrome [Tietze]: Secondary | ICD-10-CM

## 2018-04-22 DIAGNOSIS — R053 Chronic cough: Secondary | ICD-10-CM | POA: Insufficient documentation

## 2018-04-22 HISTORY — DX: Chondrocostal junction syndrome (tietze): M94.0

## 2018-04-22 MED ORDER — LOSARTAN POTASSIUM 100 MG PO TABS: 100.0000 mg | ORAL_TABLET | Freq: Every day | ORAL | 11 refills | Status: DC

## 2018-04-22 NOTE — Assessment & Plan Note (Signed)
Fairly classic presentation after increased cough Discussed tylenol, heat, lidocaine patch (OTC) No concerns for other pathology based on his presentation

## 2018-04-22 NOTE — Assessment & Plan Note (Signed)
He states this goes back 10 years (before lisinopril apparently) Chronic drainage but doesn't endorse allergies per se Discussed trying the loratadine regularly He doesn't think it is the lisinopril but I think it is worth trying a change in therapy---will switch to [facility]
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
## 2018-06-01 ENCOUNTER — Other Ambulatory Visit: Payer: Self-pay | Admitting: Internal Medicine

## 2018-06-01 NOTE — Telephone Encounter (Signed)
Pharmacy comment: Product Backordered/Unavailable: Losartan 100 AND 50MG  ON BACKORDER.PLEASE SEND NEW SCRIPT FOR ALTERNATIVE.  They have built an rx for Benicar 100mg  to replace it

## 2018-09-27 ENCOUNTER — Other Ambulatory Visit: Payer: Self-pay

## 2018-09-27 ENCOUNTER — Ambulatory Visit (INDEPENDENT_AMBULATORY_CARE_PROVIDER_SITE_OTHER): Payer: 59 | Admitting: Internal Medicine

## 2018-09-27 ENCOUNTER — Encounter: Payer: Self-pay | Admitting: Internal Medicine

## 2018-09-27 VITALS — BP 118/84 | HR 78 | Temp 97.9°F | Ht 69.5 in | Wt 272.0 lb

## 2018-09-27 DIAGNOSIS — Z23 Encounter for immunization: Secondary | ICD-10-CM

## 2018-09-27 DIAGNOSIS — I10 Essential (primary) hypertension: Secondary | ICD-10-CM | POA: Diagnosis not present

## 2018-09-27 DIAGNOSIS — Z85820 Personal history of malignant melanoma of skin: Secondary | ICD-10-CM

## 2018-09-27 DIAGNOSIS — E785 Hyperlipidemia, unspecified: Secondary | ICD-10-CM | POA: Diagnosis not present

## 2018-09-27 DIAGNOSIS — Z125 Encounter for screening for malignant neoplasm of prostate: Secondary | ICD-10-CM | POA: Diagnosis not present

## 2018-09-27 DIAGNOSIS — Z0000 Encounter for general adult medical examination without abnormal findings: Secondary | ICD-10-CM | POA: Diagnosis not present

## 2018-09-27 LAB — CBC
HCT: 43.2 % (ref 39.0–52.0)
Hemoglobin: 14.6 g/dL (ref 13.0–17.0)
MCHC: 33.7 g/dL (ref 30.0–36.0)
MCV: 99 fl (ref 78.0–100.0)
Platelets: 277 10*3/uL (ref 150.0–400.0)
RBC: 4.36 Mil/uL (ref 4.22–5.81)
RDW: 14 % (ref 11.5–15.5)
WBC: 6.5 10*3/uL (ref 4.0–10.5)

## 2018-09-27 LAB — PSA: PSA: 0.42 ng/mL (ref 0.10–4.00)

## 2018-09-27 LAB — COMPREHENSIVE METABOLIC PANEL
ALT: 32 U/L (ref 0–53)
AST: 32 U/L (ref 0–37)
Albumin: 4.1 g/dL (ref 3.5–5.2)
Alkaline Phosphatase: 63 U/L (ref 39–117)
BUN: 14 mg/dL (ref 6–23)
CO2: 28 mEq/L (ref 19–32)
Calcium: 9.5 mg/dL (ref 8.4–10.5)
Chloride: 106 mEq/L (ref 96–112)
Creatinine, Ser: 1 mg/dL (ref 0.40–1.50)
GFR: 74.75 mL/min (ref >60.00)
Glucose, Bld: 89 mg/dL (ref 70–99)
Potassium: 4.7 mEq/L (ref 3.5–5.1)
Sodium: 141 mEq/L (ref 135–145)
Total Bilirubin: 0.7 mg/dL (ref 0.2–1.2)
Total Protein: 7 g/dL (ref 6.0–8.3)

## 2018-09-27 LAB — LIPID PANEL
Cholesterol: 208 mg/dL — ABNORMAL HIGH (ref 0–200)
HDL: 29.9 mg/dL — ABNORMAL LOW (ref >39.00)
NonHDL: 178.17
Total CHOL/HDL Ratio: 7
Triglycerides: 203 mg/dL — ABNORMAL HIGH (ref 0.0–149.0)
VLDL: 40.6 mg/dL — ABNORMAL HIGH (ref 0.0–40.0)

## 2018-09-27 LAB — LDL CHOLESTEROL, DIRECT: Direct LDL: 143 mg/dL

## 2018-09-27 NOTE — Assessment & Plan Note (Signed)
Needs to keep up with skin exams

## 2018-09-27 NOTE — Assessment & Plan Note (Signed)
Will recheck levels

## 2018-09-27 NOTE — Assessment & Plan Note (Signed)
BP Readings from Last 3 Encounters:  09/27/18 118/84  09/20/17 110/80  06/15/17 122/78   Good control

## 2018-09-27 NOTE — Addendum Note (Signed)
Addended by: Pilar Grammes on: 09/27/2018 10:15 AM   Modules accepted: Orders

## 2018-09-27 NOTE — Assessment & Plan Note (Signed)
Healthy but has let himself go Will give prevnar and flu vaccines Colon is due 2022 Discussed PSA testing--- will do it Prefers no shingrix
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
## 2018-11-20 ENCOUNTER — Encounter (HOSPITAL_COMMUNITY): Payer: Self-pay | Admitting: *Deleted

## 2018-11-20 ENCOUNTER — Emergency Department (HOSPITAL_COMMUNITY): Payer: 59

## 2018-11-20 ENCOUNTER — Other Ambulatory Visit: Payer: Self-pay

## 2018-11-20 ENCOUNTER — Inpatient Hospital Stay (HOSPITAL_COMMUNITY)
Admission: EM | Admit: 2018-11-20 | Discharge: 2018-11-22 | DRG: 175 | Disposition: A | Discharge status: Home / Self Care | Payer: 59 | Attending: Family Medicine | Admitting: Family Medicine

## 2018-11-20 DIAGNOSIS — R55 Syncope and collapse: Secondary | ICD-10-CM | POA: Diagnosis not present

## 2018-11-20 DIAGNOSIS — I2609 Other pulmonary embolism with acute cor pulmonale: Secondary | ICD-10-CM | POA: Diagnosis not present

## 2018-11-20 DIAGNOSIS — Z87442 Personal history of urinary calculi: Secondary | ICD-10-CM

## 2018-11-20 DIAGNOSIS — Z809 Family history of malignant neoplasm, unspecified: Secondary | ICD-10-CM

## 2018-11-20 DIAGNOSIS — I214 Non-ST elevation (NSTEMI) myocardial infarction: Secondary | ICD-10-CM | POA: Diagnosis present

## 2018-11-20 DIAGNOSIS — E669 Obesity, unspecified: Secondary | ICD-10-CM | POA: Diagnosis present

## 2018-11-20 DIAGNOSIS — M109 Gout, unspecified: Secondary | ICD-10-CM | POA: Diagnosis present

## 2018-11-20 DIAGNOSIS — N400 Enlarged prostate without lower urinary tract symptoms: Secondary | ICD-10-CM | POA: Diagnosis present

## 2018-11-20 DIAGNOSIS — I2699 Other pulmonary embolism without acute cor pulmonale: Secondary | ICD-10-CM | POA: Diagnosis present

## 2018-11-20 DIAGNOSIS — J302 Other seasonal allergic rhinitis: Secondary | ICD-10-CM | POA: Diagnosis present

## 2018-11-20 DIAGNOSIS — J9601 Acute respiratory failure with hypoxia: Secondary | ICD-10-CM

## 2018-11-20 DIAGNOSIS — E785 Hyperlipidemia, unspecified: Secondary | ICD-10-CM | POA: Diagnosis present

## 2018-11-20 DIAGNOSIS — R7989 Other specified abnormal findings of blood chemistry: Secondary | ICD-10-CM

## 2018-11-20 DIAGNOSIS — Z7982 Long term (current) use of aspirin: Secondary | ICD-10-CM

## 2018-11-20 DIAGNOSIS — I471 Supraventricular tachycardia: Secondary | ICD-10-CM | POA: Diagnosis present

## 2018-11-20 DIAGNOSIS — Z85820 Personal history of malignant melanoma of skin: Secondary | ICD-10-CM

## 2018-11-20 DIAGNOSIS — Z20828 Contact with and (suspected) exposure to other viral communicable diseases: Secondary | ICD-10-CM | POA: Diagnosis present

## 2018-11-20 DIAGNOSIS — R778 Other specified abnormalities of plasma proteins: Secondary | ICD-10-CM

## 2018-11-20 DIAGNOSIS — Z86010 Personal history of colonic polyps: Secondary | ICD-10-CM

## 2018-11-20 DIAGNOSIS — I2510 Atherosclerotic heart disease of native coronary artery without angina pectoris: Secondary | ICD-10-CM | POA: Diagnosis present

## 2018-11-20 DIAGNOSIS — Z8249 Family history of ischemic heart disease and other diseases of the circulatory system: Secondary | ICD-10-CM

## 2018-11-20 DIAGNOSIS — K219 Gastro-esophageal reflux disease without esophagitis: Secondary | ICD-10-CM | POA: Diagnosis present

## 2018-11-20 DIAGNOSIS — Z881 Allergy status to other antibiotic agents status: Secondary | ICD-10-CM

## 2018-11-20 DIAGNOSIS — Z79899 Other long term (current) drug therapy: Secondary | ICD-10-CM

## 2018-11-20 DIAGNOSIS — Z86718 Personal history of other venous thrombosis and embolism: Secondary | ICD-10-CM

## 2018-11-20 DIAGNOSIS — I10 Essential (primary) hypertension: Secondary | ICD-10-CM | POA: Diagnosis present

## 2018-11-20 DIAGNOSIS — Z6839 Body mass index (BMI) 39.0-39.9, adult: Secondary | ICD-10-CM

## 2018-11-20 DIAGNOSIS — Z86006 Personal history of melanoma in-situ: Secondary | ICD-10-CM

## 2018-11-20 HISTORY — DX: Other pulmonary embolism without acute cor pulmonale: I26.99

## 2018-11-20 LAB — COMPREHENSIVE METABOLIC PANEL
ALT: 71 U/L — ABNORMAL HIGH (ref 0–44)
AST: 54 U/L — ABNORMAL HIGH (ref 15–41)
Albumin: 3.3 g/dL — ABNORMAL LOW (ref 3.5–5.0)
Alkaline Phosphatase: 64 U/L (ref 38–126)
Anion gap: 11 (ref 5–15)
BUN: 12 mg/dL (ref 8–23)
CO2: 22 mmol/L (ref 22–32)
Calcium: 8.4 mg/dL — ABNORMAL LOW (ref 8.9–10.3)
Chloride: 107 mmol/L (ref 98–111)
Creatinine, Ser: 1.11 mg/dL (ref 0.61–1.24)
GFR calc Af Amer: >60 mL/min (ref >60)
GFR calc non Af Amer: >60 mL/min (ref >60)
Glucose, Bld: 102 mg/dL — ABNORMAL HIGH (ref 70–99)
Potassium: 4.1 mmol/L (ref 3.5–5.1)
Sodium: 140 mmol/L (ref 135–145)
Total Bilirubin: 0.4 mg/dL (ref 0.3–1.2)
Total Protein: 6.4 g/dL — ABNORMAL LOW (ref 6.5–8.1)

## 2018-11-20 LAB — CBC WITH DIFFERENTIAL/PLATELET
Abs Immature Granulocytes: 0.05 10*3/uL (ref 0.00–0.07)
Basophils Absolute: 0.1 10*3/uL (ref 0.0–0.1)
Basophils Relative: 1 %
Eosinophils Absolute: 0.1 10*3/uL (ref 0.0–0.5)
Eosinophils Relative: 1 %
HCT: 43 % (ref 39.0–52.0)
Hemoglobin: 14.1 g/dL (ref 13.0–17.0)
Immature Granulocytes: 1 %
Lymphocytes Relative: 16 %
Lymphs Abs: 1.5 10*3/uL (ref 0.7–4.0)
MCH: 32.6 pg (ref 26.0–34.0)
MCHC: 32.8 g/dL (ref 30.0–36.0)
MCV: 99.5 fL (ref 80.0–100.0)
Monocytes Absolute: 0.8 10*3/uL (ref 0.1–1.0)
Monocytes Relative: 9 %
Neutro Abs: 6.5 10*3/uL (ref 1.7–7.7)
Neutrophils Relative %: 72 %
Platelets: 161 10*3/uL (ref 150–400)
RBC: 4.32 MIL/uL (ref 4.22–5.81)
RDW: 13.5 % (ref 11.5–15.5)
WBC: 9 10*3/uL (ref 4.0–10.5)
nRBC: 0 % (ref 0.0–0.2)

## 2018-11-20 LAB — URINALYSIS, ROUTINE W REFLEX MICROSCOPIC
Bilirubin Urine: NEGATIVE
Glucose, UA: NEGATIVE mg/dL
Hgb urine dipstick: NEGATIVE
Ketones, ur: NEGATIVE mg/dL
Leukocytes,Ua: NEGATIVE
Nitrite: NEGATIVE
Protein, ur: NEGATIVE mg/dL
Specific Gravity, Urine: 1.01 (ref 1.005–1.030)
pH: 5 (ref 5.0–8.0)

## 2018-11-20 LAB — TROPONIN I (HIGH SENSITIVITY)
Troponin I (High Sensitivity): 287 ng/L (ref <18)
Troponin I (High Sensitivity): 799 ng/L (ref <18)

## 2018-11-20 LAB — BRAIN NATRIURETIC PEPTIDE: B Natriuretic Peptide: 19.3 pg/mL (ref 0.0–100.0)

## 2018-11-20 LAB — SARS CORONAVIRUS 2 (TAT 6-24 HRS): SARS Coronavirus 2: NEGATIVE

## 2018-11-20 LAB — LACTIC ACID, PLASMA: Lactic Acid, Venous: 1.4 mmol/L (ref 0.5–1.9)

## 2018-11-20 MED ORDER — HEPARIN SODIUM (PORCINE) 5000 UNIT/ML IJ SOLN: 60.0000 [IU]/kg | Freq: Once | INTRAMUSCULAR | Status: DC

## 2018-11-20 MED ORDER — ACETAMINOPHEN 325 MG PO TABS
650.0000 mg | ORAL_TABLET | Freq: Four times a day (QID) | ORAL | Status: DC | PRN
  Administered 2018-11-21: 650 mg via ORAL
  Filled 2018-11-20: qty 2

## 2018-11-20 MED ORDER — LORATADINE 10 MG PO TABS: 10.0000 mg | ORAL_TABLET | Freq: Every evening | ORAL | Status: DC | PRN

## 2018-11-20 MED ORDER — HEPARIN (PORCINE) 25000 UT/250ML-% IV SOLN: 10.0000 [IU]/kg/h | INTRAVENOUS | Status: DC

## 2018-11-20 MED ORDER — ASPIRIN 81 MG PO CHEW
324.0000 mg | CHEWABLE_TABLET | Freq: Once | ORAL | Status: AC
  Administered 2018-11-20: 324 mg via ORAL
  Filled 2018-11-20: qty 4

## 2018-11-20 MED ORDER — ACETAMINOPHEN 650 MG RE SUPP: 650.0000 mg | Freq: Four times a day (QID) | RECTAL | Status: DC | PRN

## 2018-11-20 MED ORDER — ROSUVASTATIN CALCIUM 20 MG PO TABS
20.0000 mg | ORAL_TABLET | Freq: Every day | ORAL | Status: DC
  Administered 2018-11-21: 20 mg via ORAL
  Filled 2018-11-20: qty 1

## 2018-11-20 MED ORDER — IRBESARTAN 300 MG PO TABS
300.0000 mg | ORAL_TABLET | Freq: Every day | ORAL | Status: DC
  Administered 2018-11-21 – 2018-11-22 (×2): 300 mg via ORAL
  Filled 2018-11-20 (×2): qty 1

## 2018-11-20 MED ORDER — ASPIRIN EC 81 MG PO TBEC
81.0000 mg | DELAYED_RELEASE_TABLET | Freq: Every day | ORAL | Status: DC
  Administered 2018-11-21 – 2018-11-22 (×2): 81 mg via ORAL
  Filled 2018-11-20 (×2): qty 1

## 2018-11-20 MED ORDER — HEPARIN (PORCINE) 25000 UT/250ML-% IV SOLN
1400.0000 [IU]/h | INTRAVENOUS | Status: DC
  Administered 2018-11-20: 1200 [IU]/h via INTRAVENOUS
  Filled 2018-11-20: qty 250

## 2018-11-20 MED ORDER — HEPARIN BOLUS VIA INFUSION
4000.0000 [IU] | Freq: Once | INTRAVENOUS | Status: AC
  Administered 2018-11-20: 4000 [IU] via INTRAVENOUS
  Filled 2018-11-20: qty 4000

## 2018-11-20 MED ORDER — ONDANSETRON HCL 4 MG/2ML IJ SOLN: 4.0000 mg | Freq: Four times a day (QID) | INTRAMUSCULAR | Status: DC | PRN

## 2018-11-20 MED ORDER — IOHEXOL 350 MG/ML SOLN
75.0000 mL | Freq: Once | INTRAVENOUS | Status: AC | PRN
  Administered 2018-11-20: 75 mL via INTRAVENOUS

## 2018-11-20 MED ORDER — GUAIFENESIN ER 600 MG PO TB12: 600.0000 mg | ORAL_TABLET | Freq: Two times a day (BID) | ORAL | Status: DC | PRN
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
## 2018-11-20 NOTE — ED Triage Notes (Signed)
The pt arrived by [facility] from [facility]  Where the pt walked outside became dizzy and fell onto the ground and was unresponsive for a few seconds  His son was at his side  He reports feeling nauseated  When the dizziness  Occurred  When ems arrived he  Had a low blood pressurfe theyh gave him  A liter of fluid and placed him on 02 at 6 liters  zofran 4mg  given iv cb g 114   On arrival the pt is alert and  Oriented skin warm and dry.

## 2018-11-20 NOTE — ED Notes (Addendum)
Pt's son asked to talk to a doctor to discuss about his dad's chronic cough that has been going on in the last 8 years.

## 2018-11-20 NOTE — Progress Notes (Signed)
ANTICOAGULATION CONSULT NOTE - Initial Consult  Pharmacy Consult for heparin Indication: chest pain/ACS  Allergies  Allergen Reactions  . Cefzil [Cefprozil] Hives    Patient Measurements: Height: 5' 9.5" (176.5 cm) Weight: 272 lb (123.4 kg) IBW/kg (Calculated) : 71.85 Heparin Dosing Weight: 100kg  Vital Signs: Temp: 97.6 F (36.4 C) (11/15 1629) Temp Source: Oral (11/15 1629) BP: 118/79 (11/15 1629) Pulse Rate: 103 (11/15 1629)  Labs: Recent Labs    11/20/18 1704  HGB 14.1  HCT 43.0  PLT 161  CREATININE 1.11  TROPONINIHS 287*    Estimated Creatinine Clearance: 85.6 mL/min (by C-G formula based on SCr of 1.11 mg/dL).   Medical History: Past Medical History:  Diagnosis Date  . BPH (benign prostatic hypertrophy)   . Cancer ([place])   . Colon polyps   . DVT (deep venous thrombosis) (HCC) 1999   6 months of coumadin  . Gout    unconfirmed diagnosis  . History of kidney stones   . Hyperlipidemia   . Hypertension   . Melanoma in situ ([place])    Back    Medications:  Infusions:  . heparin      Assessment: 33 yom presented to the ED after syncopal event. Troponin elevated and now starting IV heparin. Baseline CBC is WNL. He is not on anticoagulation PTA.   Goal of Therapy:  Heparin level 0.3-0.7 units/ml Monitor platelets by anticoagulation protocol: Yes   Plan:  Heparin bolus 4000 units IV x 1 Heparin gtt 1200 units/hr Check a 6 hr heparin level Daily heparin level and CBC  Raeqwon Lux, Rande Lawman 11/20/2018,6:09 PM
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
## 2018-11-20 NOTE — ED Notes (Signed)
Verified heparin  By Fredrich Romans

## 2018-11-20 NOTE — Progress Notes (Signed)
Attempted to get report. 

## 2018-11-21 ENCOUNTER — Encounter (HOSPITAL_COMMUNITY): Payer: Self-pay

## 2018-11-21 ENCOUNTER — Other Ambulatory Visit: Payer: Self-pay

## 2018-11-21 ENCOUNTER — Encounter (HOSPITAL_COMMUNITY): Payer: 59

## 2018-11-21 ENCOUNTER — Observation Stay (HOSPITAL_BASED_OUTPATIENT_CLINIC_OR_DEPARTMENT_OTHER): Payer: 59

## 2018-11-21 DIAGNOSIS — R05 Cough: Secondary | ICD-10-CM

## 2018-11-21 DIAGNOSIS — J9601 Acute respiratory failure with hypoxia: Secondary | ICD-10-CM

## 2018-11-21 DIAGNOSIS — I2609 Other pulmonary embolism with acute cor pulmonale: Secondary | ICD-10-CM | POA: Diagnosis not present

## 2018-11-21 DIAGNOSIS — I361 Nonrheumatic tricuspid (valve) insufficiency: Secondary | ICD-10-CM

## 2018-11-21 DIAGNOSIS — R778 Other specified abnormalities of plasma proteins: Secondary | ICD-10-CM

## 2018-11-21 DIAGNOSIS — R7989 Other specified abnormal findings of blood chemistry: Secondary | ICD-10-CM

## 2018-11-21 LAB — BASIC METABOLIC PANEL
Anion gap: 9 (ref 5–15)
BUN: 12 mg/dL (ref 8–23)
CO2: 23 mmol/L (ref 22–32)
Calcium: 8.6 mg/dL — ABNORMAL LOW (ref 8.9–10.3)
Chloride: 110 mmol/L (ref 98–111)
Creatinine, Ser: 0.96 mg/dL (ref 0.61–1.24)
GFR calc Af Amer: >60 mL/min (ref >60)
GFR calc non Af Amer: >60 mL/min (ref >60)
Glucose, Bld: 119 mg/dL — ABNORMAL HIGH (ref 70–99)
Potassium: 4.3 mmol/L (ref 3.5–5.1)
Sodium: 142 mmol/L (ref 135–145)

## 2018-11-21 LAB — CBC
HCT: 41.5 % (ref 39.0–52.0)
Hemoglobin: 14.1 g/dL (ref 13.0–17.0)
MCH: 33.1 pg (ref 26.0–34.0)
MCHC: 34 g/dL (ref 30.0–36.0)
MCV: 97.4 fL (ref 80.0–100.0)
Platelets: 163 10*3/uL (ref 150–400)
RBC: 4.26 MIL/uL (ref 4.22–5.81)
RDW: 13.6 % (ref 11.5–15.5)
WBC: 10.2 10*3/uL (ref 4.0–10.5)
nRBC: 0 % (ref 0.0–0.2)

## 2018-11-21 LAB — TROPONIN I (HIGH SENSITIVITY): Troponin I (High Sensitivity): 600 ng/L (ref <18)

## 2018-11-21 LAB — HIV ANTIBODY (ROUTINE TESTING W REFLEX): HIV Screen 4th Generation wRfx: NONREACTIVE

## 2018-11-21 LAB — ECHOCARDIOGRAM COMPLETE
Height: 69.5 in
Weight: 4352 [oz_av]

## 2018-11-21 LAB — HEPARIN LEVEL (UNFRACTIONATED): Heparin Unfractionated: 0.24 IU/mL — ABNORMAL LOW (ref 0.30–0.70)

## 2018-11-21 LAB — MAGNESIUM: Magnesium: 2 mg/dL (ref 1.7–2.4)

## 2018-11-21 MED ORDER — APIXABAN 5 MG PO TABS: 5.0000 mg | ORAL_TABLET | Freq: Two times a day (BID) | ORAL | Status: DC

## 2018-11-21 MED ORDER — APIXABAN 5 MG PO TABS
10.0000 mg | ORAL_TABLET | Freq: Two times a day (BID) | ORAL | Status: DC
  Administered 2018-11-21 – 2018-11-22 (×3): 10 mg via ORAL
  Filled 2018-11-21 (×3): qty 2

## 2018-11-21 MED ORDER — HEPARIN BOLUS VIA INFUSION
1500.0000 [IU] | Freq: Once | INTRAVENOUS | Status: AC
  Administered 2018-11-21: 1500 [IU] via INTRAVENOUS
  Filled 2018-11-21: qty 1500

## 2018-11-21 NOTE — Progress Notes (Signed)
ANTICOAGULATION CONSULT NOTE - Initial Consult  Pharmacy Consult for apixaban Indication: pulmonary embolus  Allergies  Allergen Reactions  . Cefzil [Cefprozil] Hives    Patient Measurements: Height: 5' 9.5" (176.5 cm) Weight: 272 lb (123.4 kg) IBW/kg (Calculated) : 71.85   Vital Signs: Temp: 98.4 F (36.9 C) (11/16 0621) Temp Source: Oral (11/16 0621) BP: 123/76 (11/16 0900) Pulse Rate: 100 (11/16 0621)  Labs: Recent Labs    11/20/18 1704 11/20/18 1818 11/21/18 0226  HGB 14.1  --  14.1  HCT 43.0  --  41.5  PLT 161  --  163  HEPARINUNFRC  --   --  0.24*  CREATININE 1.11  --  0.96  TROPONINIHS 287* 799* 600*    Estimated Creatinine Clearance: 99 mL/min (by C-G formula based on SCr of 0.96 mg/dL).   Medical History: Past Medical History:  Diagnosis Date  . BPH (benign prostatic hypertrophy)   . Cancer ([place])   . Colon polyps   . DVT (deep venous thrombosis) (HCC) 1999   6 months of coumadin  . Gout    unconfirmed diagnosis  . History of kidney stones   . Hyperlipidemia   . Hypertension   . Melanoma in situ ([place])    Back    Medications:  Scheduled:  . apixaban  10 mg Oral BID   Followed by  . [START ON 11/28/2018] apixaban  5 mg Oral BID  . aspirin EC  81 mg Oral Daily  . irbesartan  300 mg Oral Daily  . rosuvastatin  20 mg Oral q1800    Assessment: 9 YOM came in via EMS after witnessed syncope episode. PMH significant for DVT, HLD. Chief complaint of dizziness, shortness of breath. Found to have PE on CT chest on 11/15. Pharmacy consulted to transition from heparin to apixaban.  Patient subtherapeutic on heparin (last level this AM 0.24), troponin trended up 287 to 799, now down at 600 this AM. CBC wnl. Renal function stable. Estimated CrCl about 99 mL/min. No bleeding noted.  Goal of Therapy:  anticoagulation Monitor platelets by anticoagulation protocol: Yes   Plan:  Start apixaban 10 mg PO BID 11/16, transition to apixaban 5 mg PO BID on  11/23 AM.  Monitor for signs/symptoms of bleeding, CBC, renal function.  [place] 11/21/2018,12:04 PM

## 2018-11-21 NOTE — Progress Notes (Signed)
  Echocardiogram 2D Echocardiogram has been performed.  Cody Romero 11/21/2018, 11:01 AM

## 2018-11-21 NOTE — Care Management (Signed)
[employer] benefits check sent and pending.  Keely Drennan RN, BSN, NCM-BC, ACM-RN [phone] 

## 2018-11-21 NOTE — Progress Notes (Signed)
Family Medicine Teaching Service Daily Progress Note Intern Pager: [phone]  Patient name: Cody Romero Medical record number: [id number] Date of birth: January 05, 1951 Age: 68 y.o. Gender: male  Primary Care Provider: Patient, No Pcp Per Consultants: None  Code Status: Full  Pt Overview and Major Events to Date:  11/15-presented after syncopal episode and found to have pulmonary embolism. 11/15-heparinized  Assessment and Plan: Pulmonary embolism Patient presented to the ED after syncopal episode.  EKG showed sinus tach with no ST elevations and high-sensitivity troponin of 287.  CT angio chest showed pulmonary embolism with large burden of subocclusive embolus present in the distal left and right pulmonary arteries and lobar to segmental branch vessels and evidence concerning for right heart strain.  Patient was heparinized and will transition to Eliquis this morning -High-sensitivity troponin trended from 287 > 799 > 600 -Morning CBC wnl -Morning CMP showed mild hypocalcemia 8.6 but was within normal limits otherwise -Morning magnesium was 9 -Morning EKG showed sinus tachycardia -Continue heparin until transition to Eliquis -Heart healthy diet -Wean O2 as tolerated -Ambulate with pulse ox today  Chronic cough Patient has reportedly had chronic cough for which she was transitioned from lisinopril to olmesartan.  Per chart review Medicare she had a cough prior to lisinopril and the cough is persisted despite the changes.  Patient currently taking Claritin daily for possible postnasal drip.  Patient also taking Mucinex. -Continue home medications -Consider Flonase and a PPI -Consider discontinuing ARB given it could be related to the cough as well  Dizziness Patient prescribed meclizine 25 mg 3 times daily.  Patient reports dizziness intermittently for the past couple of years.  Orthostatic vital signs were negative. -Meclizine as needed -Follow-up on echo  Hypertension Home  medications include olmesartan 40 mg.  Blood pressure since admissions have ranged from 111/86-138/95. -Irbesartan 300 mg daily -Continue to monitor blood pressure  Hyperlipidemia Lipid panel 09/27/2018 cholesterol 208, triglycerides 203, HDL 29.9, LDL 143.0.  CT chest angio showed coronary artery disease.  Patient reports he was on a statin but was taken off of it by his PCP.  Patient has significant family history of heart disease. -Started Crestor 20 mg daily  BPH Patient does not take medications for his BPH.  Last PSA on 09/27/2018 was 0.42 and has been stable for the past 2 years.  Patient reports no difficulty with nighttime urination  Seasonal allergies  Ports ongoing nasal congestion and chronic cough.  Takes Claritin 10 mg daily. -Continue home medications -Consider adding Flonase  for symptomatic relief  Hx of melanoma  Had melanoma on midline upper back excised in June 2019. Per PCP, has not been consistent with skin exams.    Gout Occurs intermittently in his R great toe.  Reports last flareup was about 3 weeks ago. Does not take any medicinations.    FEN/GI: heart healthy diet, replete electrolytes as needed  Prophylaxis: Heparin  Disposition: possibly today after echo is O2 requirement resolves   Subjective:  Patient reports he is doing well this morning and wants to go home.  He denies any unusual shortness of breath but does acknowledge that he gets short of breath when laying on his back and has for many years now.  He says that he sleeps on his side or stomach does not get short of breath that way.  He denies feeling shortness of breath when sitting or standing but says that his O2 monitor has read in the 80s today when he is walking to the  bathroom.  Denies any headaches, chest pain, change in vision, nausea, vomiting, diarrhea, constipation, lower extremity edema or pain.  Objective: Temp:  [97.6 F (36.4 C)-98.4 F (36.9 C)] 98.4 F (36.9 C) (11/16  0621) Pulse Rate:  [100-118] 100 (11/16 0621) Resp:  [14-26] 20 (11/16 0621) BP: (107-138)/(74-96) 107/74 (11/16 0621) SpO2:  [85 %-96 %] 94 % (11/16 0621) Weight:  [123.4 kg] 123.4 kg (11/15 1800) Physical Exam: General: Alert, well-appearing, no acute distress Cardiovascular: Tachycardic rate but regular rhythm Respiratory: Clear to auscultation bilaterally, no increased work of breathing.  On 2.5 L O2 on evaluation and was satting in the low 90s.  When patient sat up in rested legs on the side of the bed he satted in the high 90s.  Cut O2 off while sitting on the edge of the bed and his oxygen saturation dropped to the low 90s.  When he laid down without oxygen his saturations dropped to the high 80s. Abdomen: Soft, nontender, positive bowel sounds Extremities: No lower extremity edema or erythema  Laboratory: Recent Labs  Lab 11/20/18 1704 11/21/18 0226  WBC 9.0 10.2  HGB 14.1 14.1  HCT 43.0 41.5  PLT 161 163   Recent Labs  Lab 11/20/18 1704 11/21/18 0226  NA 140 142  K 4.1 4.3  CL 107 110  CO2 22 23  BUN 12 12  CREATININE 1.11 0.96  CALCIUM 8.4* 8.6*  PROT 6.4*  --   BILITOT 0.4  --   ALKPHOS 64  --   ALT 71*  --   AST 54*  --   GLUCOSE 102* 119*   Troponin-287 > 799 > 600 Lactic acid-1.4 BNP-19.3 Heparin-0.24  Imaging/Diagnostic Tests: Ct Angio Chest Pe W And/or Wo Contrast  Result Date: 11/20/2018 CLINICAL DATA:  [place] to ground, unresponsive EXAM: CT ANGIOGRAPHY CHEST WITH CONTRAST TECHNIQUE: Multidetector CT imaging of the chest was performed using the standard protocol during bolus administration of intravenous contrast. Multiplanar CT image reconstructions and MIPs were obtained to evaluate the vascular anatomy. CONTRAST:  56mL OMNIPAQUE IOHEXOL 350 MG/ML SOLN COMPARISON:  None. FINDINGS: Cardiovascular: Positive examination for pulmonary embolism with a large burden of subocclusive embolus present in the distal left and right pulmonary arteries and lobar  to segmental branch vessels. Cardiomegaly. Enlargement of the RV LV ratio to 1.8. Three-vessel coronary artery calcifications. Enlargement of the main pulmonary artery to 3.3 cm. No pericardial effusion. Mediastinum/Nodes: No enlarged mediastinal, hilar, or axillary lymph nodes. Thyroid gland, trachea, and esophagus demonstrate no significant findings. Lungs/Pleura: Lungs are clear. No pleural effusion or pneumothorax. Upper Abdomen: No acute abnormality. Musculoskeletal: No chest wall abnormality. No acute or significant osseous findings. Review of the MIP images confirms the above findings. IMPRESSION: 1. Positive examination for pulmonary embolism with a large burden of subocclusive embolus present in the distal left and right pulmonary arteries and lobar to segmental branch vessels. 2. Cardiomegaly. Enlargement of the RV LV ratio to 1.8. Enlargement of the main pulmonary artery to 3.3 cm. Findings are concerning for right heart strain. Correlate with echocardiographic findings. 3.  Coronary artery disease. These results were called by telephone at the time of interpretation on 11/20/2018 at 7:58 pm to provider Dr. Sherril Croon, who verbally acknowledged these results. Electronically Signed   By: Eddie Candle M.D.   On: 11/20/2018 20:02   Dg Chest Portable 1 View  Result Date: 11/20/2018 CLINICAL DATA:  Shortness of breath. Syncopal episode. EXAM: PORTABLE CHEST 1 VIEW COMPARISON:  05/13/2010 FINDINGS: The cardiac silhouette remains  borderline enlarged. Interval mild prominence of the pulmonary vasculature and minimal prominence of the interstitial markings. No pleural fluid or Kerley lines. Thoracic spine degenerative changes. IMPRESSION: Interval mild pulmonary vascular congestion and minimal chronic interstitial lung disease. Electronically Signed   By: Claudie Revering M.D.   On: 11/20/2018 17:14    Gifford Shave, MD 11/21/2018, 6:29 AM PGY-1, [place] Intern pager: [phone], text  pages welcome

## 2018-11-21 NOTE — Progress Notes (Signed)
[facility] for heparin Indication: chest pain/ACS  Allergies  Allergen Reactions  . Cefzil [Cefprozil] Hives    Patient Measurements: Height: 5' 9.5" (176.5 cm) Weight: 272 lb (123.4 kg) IBW/kg (Calculated) : 71.85 Heparin Dosing Weight: 100kg  Vital Signs: Temp: 97.8 F (36.6 C) (11/16 0106) Temp Source: Oral (11/16 0106) BP: 128/94 (11/16 0108) Pulse Rate: 106 (11/16 0106)  Labs: Recent Labs    11/20/18 1704 11/20/18 1818 11/21/18 0226  HGB 14.1  --  14.1  HCT 43.0  --  41.5  PLT 161  --  163  HEPARINUNFRC  --   --  0.24*  CREATININE 1.11  --  0.96  TROPONINIHS 287* 799*  --     Estimated Creatinine Clearance: 99 mL/min (by C-G formula based on SCr of 0.96 mg/dL).   Assessment: 89 yom presented to the ED after syncopal event. Troponin elevated and now starting IV heparin. Baseline CBC is WNL. He is not on anticoagulation PTA.   Heparin level subtherapeutic (0.24) on gtt at 1200 units/hr. No issues with line or bleeding reported per RN.  Goal of Therapy:  Heparin level 0.3-0.7 units/ml Monitor platelets by anticoagulation protocol: Yes   Plan:  Rebolus heparin 1500 units x 1  Increase heparin gtt to 1400 units/hr Check a 6 hr heparin level  Sherlon Handing, PharmD, BCPS CGV Clinical pharmacist phone [phone] 11/21/2018,4:11 AM

## 2018-11-21 NOTE — TOC Benefit Eligibility Note (Signed)
Transition of Care [facility]) Benefit Eligibility Note    Patient Details  Name: Cody Romero MRN: [id number] Date of Birth: January 25, 1950   Medication/Dose: Eliquis 5 mg twice daily  &  2.5 mg twice daily.  Covered?: Yes  Tier: 2 Drug     Spoke with Person/Company/Phone Number:: Arlene / [phone]? [employer]  Co-Pay: 25.00 for a 30 day supply retail / 50.00 for a 90 day supply Mail Order  Prior Approval: No  Deductible: (Information Unavailable)  Additional Notes: Apixaban is non formulary    Orbie Pyo Phone Number: 11/21/2018, 2:41 PM

## 2018-11-21 NOTE — Progress Notes (Signed)
SATURATION QUALIFICATIONS: (This note is used to comply with regulatory documentation for home oxygen)  Patient Saturations on Room Air at Rest = 89%  Patient Saturations on Room Air while Ambulating = 85%  Patient Saturations on 2 Liters of oxygen while Ambulating = 88% Patient Saturations on 3 Liters of oxygen while Ambulating = 90% Patient Saturations on 4 Liters of oxygen while Ambulating = 92%  Please briefly explain why patient needs home oxygen: Patient not able to keep sats at a satisfactory rate.

## 2018-11-22 ENCOUNTER — Encounter: Payer: Self-pay | Admitting: Family Medicine

## 2018-11-22 DIAGNOSIS — N400 Enlarged prostate without lower urinary tract symptoms: Secondary | ICD-10-CM | POA: Diagnosis present

## 2018-11-22 DIAGNOSIS — I2510 Atherosclerotic heart disease of native coronary artery without angina pectoris: Secondary | ICD-10-CM | POA: Diagnosis present

## 2018-11-22 DIAGNOSIS — R55 Syncope and collapse: Secondary | ICD-10-CM | POA: Diagnosis present

## 2018-11-22 DIAGNOSIS — Z86718 Personal history of other venous thrombosis and embolism: Secondary | ICD-10-CM | POA: Diagnosis not present

## 2018-11-22 DIAGNOSIS — Z7982 Long term (current) use of aspirin: Secondary | ICD-10-CM | POA: Diagnosis not present

## 2018-11-22 DIAGNOSIS — M109 Gout, unspecified: Secondary | ICD-10-CM | POA: Diagnosis present

## 2018-11-22 DIAGNOSIS — I10 Essential (primary) hypertension: Secondary | ICD-10-CM | POA: Diagnosis present

## 2018-11-22 DIAGNOSIS — Z8249 Family history of ischemic heart disease and other diseases of the circulatory system: Secondary | ICD-10-CM | POA: Diagnosis not present

## 2018-11-22 DIAGNOSIS — I2609 Other pulmonary embolism with acute cor pulmonale: Secondary | ICD-10-CM | POA: Diagnosis present

## 2018-11-22 DIAGNOSIS — Z87442 Personal history of urinary calculi: Secondary | ICD-10-CM | POA: Diagnosis not present

## 2018-11-22 DIAGNOSIS — J9601 Acute respiratory failure with hypoxia: Secondary | ICD-10-CM | POA: Diagnosis present

## 2018-11-22 DIAGNOSIS — E669 Obesity, unspecified: Secondary | ICD-10-CM | POA: Diagnosis present

## 2018-11-22 DIAGNOSIS — E785 Hyperlipidemia, unspecified: Secondary | ICD-10-CM | POA: Diagnosis present

## 2018-11-22 DIAGNOSIS — Z86006 Personal history of melanoma in-situ: Secondary | ICD-10-CM | POA: Diagnosis not present

## 2018-11-22 DIAGNOSIS — J302 Other seasonal allergic rhinitis: Secondary | ICD-10-CM | POA: Diagnosis present

## 2018-11-22 DIAGNOSIS — K219 Gastro-esophageal reflux disease without esophagitis: Secondary | ICD-10-CM | POA: Diagnosis present

## 2018-11-22 DIAGNOSIS — Z809 Family history of malignant neoplasm, unspecified: Secondary | ICD-10-CM | POA: Diagnosis not present

## 2018-11-22 DIAGNOSIS — Z85820 Personal history of malignant melanoma of skin: Secondary | ICD-10-CM | POA: Diagnosis not present

## 2018-11-22 DIAGNOSIS — Z20828 Contact with and (suspected) exposure to other viral communicable diseases: Secondary | ICD-10-CM | POA: Diagnosis present

## 2018-11-22 DIAGNOSIS — Z881 Allergy status to other antibiotic agents status: Secondary | ICD-10-CM | POA: Diagnosis not present

## 2018-11-22 DIAGNOSIS — I214 Non-ST elevation (NSTEMI) myocardial infarction: Secondary | ICD-10-CM | POA: Diagnosis present

## 2018-11-22 DIAGNOSIS — Z6839 Body mass index (BMI) 39.0-39.9, adult: Secondary | ICD-10-CM | POA: Diagnosis not present

## 2018-11-22 DIAGNOSIS — Z86010 Personal history of colonic polyps: Secondary | ICD-10-CM | POA: Diagnosis not present

## 2018-11-22 DIAGNOSIS — I471 Supraventricular tachycardia: Secondary | ICD-10-CM | POA: Diagnosis present

## 2018-11-22 LAB — CBC WITH DIFFERENTIAL/PLATELET
Abs Immature Granulocytes: 0.04 10*3/uL (ref 0.00–0.07)
Basophils Absolute: 0.1 10*3/uL (ref 0.0–0.1)
Basophils Relative: 1 %
Eosinophils Absolute: 0.2 10*3/uL (ref 0.0–0.5)
Eosinophils Relative: 3 %
HCT: 41.6 % (ref 39.0–52.0)
Hemoglobin: 13.7 g/dL (ref 13.0–17.0)
Immature Granulocytes: 1 %
Lymphocytes Relative: 21 %
Lymphs Abs: 1.8 10*3/uL (ref 0.7–4.0)
MCH: 32.9 pg (ref 26.0–34.0)
MCHC: 32.9 g/dL (ref 30.0–36.0)
MCV: 100 fL (ref 80.0–100.0)
Monocytes Absolute: 1.1 10*3/uL — ABNORMAL HIGH (ref 0.1–1.0)
Monocytes Relative: 13 %
Neutro Abs: 5.6 10*3/uL (ref 1.7–7.7)
Neutrophils Relative %: 61 %
Platelets: 167 10*3/uL (ref 150–400)
RBC: 4.16 MIL/uL — ABNORMAL LOW (ref 4.22–5.81)
RDW: 13.8 % (ref 11.5–15.5)
WBC: 8.8 10*3/uL (ref 4.0–10.5)
nRBC: 0 % (ref 0.0–0.2)

## 2018-11-22 LAB — BASIC METABOLIC PANEL
Anion gap: 10 (ref 5–15)
BUN: 12 mg/dL (ref 8–23)
CO2: 27 mmol/L (ref 22–32)
Calcium: 8.5 mg/dL — ABNORMAL LOW (ref 8.9–10.3)
Chloride: 105 mmol/L (ref 98–111)
Creatinine, Ser: 1.08 mg/dL (ref 0.61–1.24)
GFR calc Af Amer: >60 mL/min (ref >60)
GFR calc non Af Amer: >60 mL/min (ref >60)
Glucose, Bld: 114 mg/dL — ABNORMAL HIGH (ref 70–99)
Potassium: 4.3 mmol/L (ref 3.5–5.1)
Sodium: 142 mmol/L (ref 135–145)

## 2018-11-22 MED ORDER — APIXABAN 5 MG PO TABS: 10.0000 mg | ORAL_TABLET | Freq: Two times a day (BID) | ORAL | 0 refills | Status: DC

## 2018-11-22 MED ORDER — ROSUVASTATIN CALCIUM 20 MG PO TABS: 20.0000 mg | ORAL_TABLET | Freq: Every day | ORAL | 0 refills | Status: DC

## 2018-11-22 MED ORDER — APIXABAN 5 MG PO TABS: 5.0000 mg | ORAL_TABLET | Freq: Two times a day (BID) | ORAL | 0 refills | Status: DC

## 2018-11-22 MED FILL — ELIQUIS STARTER PACK 5 MG T: 5 | 30 days supply | Qty: 74 | Fill #0

## 2018-11-22 MED FILL — ROSUVASTATIN CALCIUM 20 MG: 20 | 30 days supply | Qty: 30 | Fill #0

## 2018-11-22 NOTE — Progress Notes (Signed)
SATURATION QUALIFICATIONS: (This note is used to comply with regulatory documentation for home oxygen)  Patient Saturations on Room Air at Rest = 93%  Patient Saturations on Room Air while Ambulating = 86%  Patient Saturations on 4 Liters of oxygen while Ambulating = 93%  Please briefly explain why patient needs home oxygen: Pt becomes SOB when ambulating and the O2 will help improve his oxygenation levels.

## 2018-11-22 NOTE — Progress Notes (Signed)
Pt ambulated in Hallway O2 ranged between 87-93 pt was a tad bit SOB on exertion but stated that he felt fine.

## 2018-11-22 NOTE — Discharge Instructions (Signed)
Thank you for allowing [place] to participate in your care!    You were admitted for a pulmonary embolism.  You were given anticoagulation with heparin and then transition to Eliquis.  You will need to continue on Eliquis for a long period of time.  You will initially take 10 mg (2 pills) twice daily for an additional 5 days.  You will then switched to taking 1 pill twice daily for the rest of the month.  Your primary care will prescribe you the Eliquis after this month.  You need to follow-up with a primary care provider within the next week for oxygen saturation check.  I want you to restart your home medications except for your aspirin.  There is no indication that you need to be taking daily aspirin.  Because your oxygen saturations were dropping while you are walking or going to prescribe home oxygen in case you need it.  You can use this if you feel short of breath.  You can discuss this with your new primary care doctor if you have any questions.  If you have any return symptoms of shortness of breath, passing out, chest pain, lightheadedness please seek medical attention.  If you experience worsening of your admission symptoms, develop shortness of breath, life threatening emergency, suicidal or homicidal thoughts you must seek medical attention immediately by calling 911 or calling your MD immediately  if symptoms less severe.   Information on my medicine - ELIQUIS (apixaban)  This medication education was reviewed with me or my healthcare representative as part of my discharge preparation.  The pharmacist that spoke with me during my hospital stay was:   Why was Eliquis prescribed for you? Eliquis was prescribed to treat blood clots that may have been found in the veins of your legs (deep vein thrombosis) or in your lungs (pulmonary embolism) and to reduce the risk of them occurring again.  What do You need to know about Eliquis ? The starting dose is 10 mg (two 5 mg tablets) taken TWICE  daily for the FIRST SEVEN (7) DAYS, then on  11/28/2018  the dose is reduced to ONE 5 mg tablet taken TWICE daily.  Eliquis may be taken with or without food.   Try to take the dose about the same time in the morning and in the evening. If you have difficulty swallowing the tablet whole please discuss with your pharmacist how to take the medication safely.  Take Eliquis exactly as prescribed and DO NOT stop taking Eliquis without talking to the doctor who prescribed the medication.  Stopping may increase your risk of developing a new blood clot.  Refill your prescription before you run out.  After discharge, you should have regular check-up appointments with your healthcare provider that is prescribing your Eliquis.    What do you do if you miss a dose? If a dose of ELIQUIS is not taken at the scheduled time, take it as soon as possible on the same day and twice-daily administration should be resumed. The dose should not be doubled to make up for a missed dose.  Important Safety Information A possible side effect of Eliquis is bleeding. You should call your healthcare provider right away if you experience any of the following: ? Bleeding from an injury or your nose that does not stop. ? Unusual colored urine (red or dark brown) or unusual colored stools (red or black). ? Unusual bruising for unknown reasons. ? A serious fall or if you hit your  head (even if there is no bleeding).  Some medicines may interact with Eliquis and might increase your risk of bleeding or clotting while on Eliquis. To help avoid this, consult your healthcare provider or pharmacist prior to using any new prescription or non-prescription medications, including herbals, vitamins, non-steroidal anti-inflammatory drugs (NSAIDs) and supplements.  This website has more information on Eliquis (apixaban): http://www.eliquis.com/eliquis/home

## 2018-11-22 NOTE — TOC Initial Note (Signed)
Transition of Care [facility]) - Initial/Assessment Note    Patient Details  Name: Cody Romero MRN: [id number] Date of Birth: 01/07/1950  Transition of [facility]) CM/SW Contact:    Marilu Favre, RN Phone Number: 11/22/2018, 12:12 PM  Clinical Narrative:                  Eliquis 5 mg twice daily  &  2.5 mg twice daily. Spoke with Person/Company/Phone Number:: Arlene / [phone]? [employer]  Co-Pay: 25.00 for a 30 day supply retail / 50.00 for a 90 day supply Mail Order  Prior Approval: No  Patient aware. Provided co pay card and explained. TOC will fill first prescription 30 days free.   MD ordered home oxygen. Called Zack with [facility] , who will bring portable tank to room prior to discharge.   Patient aware and voiced understanding to all of above.  Expected Discharge Plan: Home/Self Care Barriers to Discharge: No Barriers Identified   Patient Goals and CMS Choice Patient states their goals for this hospitalization and ongoing recovery are:: to go home CMS Medicare.gov Compare Post Acute Care list provided to:: Patient Choice offered to / list presented to : Patient  Expected Discharge Plan and Services Expected Discharge Plan: Home/Self Care   Discharge Planning Services: CM Consult Post Acute Care Choice: Durable Medical Equipment Living arrangements for the past 2 months: Single Family Home                 DME Arranged: Oxygen DME Agency: [employer] Date DME Agency Contacted: 11/22/18 Time DME Agency Contacted: 1211 Representative spoke with at DME Agency: [place]: NA          Prior Living Arrangements/Services Living arrangements for the past 2 months: [place] Lives with:: Self Patient language and need for interpreter reviewed:: Yes Do you feel safe going back to the place where you live?: Yes      Need for Family Participation in Patient Care: Yes (Comment) Care giver support system in place?: Yes (comment)   Criminal  Activity/Legal Involvement Pertinent to Current Situation/Hospitalization: No - Comment as needed  Activities of Daily Living Home Assistive Devices/Equipment: None ADL Screening (condition at time of admission) Patient's cognitive ability adequate to safely complete daily activities?: Yes Is the patient deaf or have difficulty hearing?: No Does the patient have difficulty seeing, even when wearing glasses/contacts?: No Does the patient have difficulty concentrating, remembering, or making decisions?: No Patient able to express need for assistance with ADLs?: Yes Does the patient have difficulty dressing or bathing?: No Independently performs ADLs?: Yes (appropriate for developmental age) Does the patient have difficulty walking or climbing stairs?: No Weakness of Legs: None Weakness of Arms/Hands: None  Permission Sought/Granted   Permission granted to share information with : Yes, Verbal Permission Granted     Permission granted to share info w AGENCY: [employer] [facility]        Emotional Assessment Appearance:: Appears stated age Attitude/Demeanor/Rapport: Engaged Affect (typically observed): Accepting Orientation: : Oriented to Self, Oriented to Place, Oriented to  Time, Oriented to Situation Alcohol / Substance Use: Not Applicable Psych Involvement: No (comment)  Admission diagnosis:  Elevated troponin [R77.8] Acute respiratory failure with hypoxia ([place]) [J96.01] Acute pulmonary embolism with acute cor pulmonale, unspecified pulmonary embolism type ([place]) [I26.09] Patient Active Problem List   Diagnosis Date Noted  . Acute respiratory failure with hypoxia ([place])   . Elevated troponin   . Pulmonary embolism ([place]) 11/20/2018  . Costochondritis 04/22/2018  .  Chronic cough 04/22/2018  . Dizziness 09/20/2017  . History of melanoma 09/16/2016  . BPH (benign prostatic hypertrophy)   . Routine general medical examination at a health care facility 09/21/2012  . Hypertension   .  Hyperlipidemia   . Gout    PCP:  Patient, No Pcp Per Pharmacy:   [id number] - [place], [place] [address] [place] [address] Phone: [phone] Fax: [place], [place] [address] [place] [address] Phone: [phone] Fax: [phone]     Social Determinants of Health (SDOH) Interventions    Readmission Risk Interventions No flowsheet data found.

## 2018-11-22 NOTE — Discharge Summary (Addendum)
[facility] Discharge Summary  Patient name: Cody Romero Medical record number: [id number] Date of birth: [birth year] Age: 68 y.o. Gender: male Date of Admission: 11/20/2018  Date of Discharge: 11/22/2018 Admitting Physician: Kinnie Feil, MD  Primary Care Provider: Patient, No Pcp Per Consultants: None  Indication for Hospitalization: Pulmonary embolus  Discharge Diagnoses/Problem List:  Pulmonary embolism Chronic cough Dizziness Hypertension Hyperlipidemia BPH Seasonal allergies History of melanoma Gout  Disposition: Home   Discharge Condition: Stable  Discharge Exam:  General: Alert, well-appearing, no acute distress Cardiovascular: Tachycardic rate but regular rhythm Respiratory: Clear to auscultation bilaterally, no increased work of breathing.    Home 1.5 L O2 when I enter the room.  He is satting in the mid 90s.  I cut the O2 off and his saturation remains in the mid to low 90s.  He sits up and his oxygen saturation is in the low 90s but he is not increasing work of breathing.  He denies any shortness of breath Abdomen: Soft, nontender, positive bowel sounds Extremities: No lower extremity edema or erythema  Brief Hospital Course:  Patient initially presented to the hospital after fainting at home which was witnessed by his son.  The son stated that he turned blue.  He was brought to the emergency room and evaluated for syncope.  Upon evaluation is found to have a pulmonary embolus started on heparin.  On day 2 (11/16) of hospitalization he was transitioned from heparin drip to p.o. Eliquis.  He was trialed with a walking challenge and his oxygen saturation dropped down to the mid 80s.  He was kept overnight and continued on between 1.5 and 2.5 L of O2.  On evaluation the morning of discharge he was stable.  He had no shortness of breath.  When trialed on room air his oxygen saturation stayed in the 90 percent range.  Patient was given  another walking trial which resulted in O2 sats ranging between 87 and 93.  He was stable for discharge and discharged home with return precautions and instructed to meet with his primary care in the next week for O2 sat check.  Issues for Follow Up:  1. Check O2 saturation. 2. Eliquis dosing and schedule. 3. Evaluate to determine if he needs further continuation of Crestor. 4. Chronic cough, consider other allergy treatment such as Flonase or Singulair. 5. Recommend outpatient referral for pulmonary for PFT and possible sleep study. 6. Patient can take honey 1 tablespoon 3 times daily to supress her cough. Patient should use his netty pot daily, followed by flonase to each nostril to decrease his chronic cough. Patient should also stay on a proton-pump inhibitor such as Omeprazole to decrease acid reflux as this can also cause cough.  Significant Procedures: None  Significant Labs and Imaging:  Recent Labs  Lab 11/20/18 1704 11/21/18 0226 11/22/18 0521  WBC 9.0 10.2 8.8  HGB 14.1 14.1 13.7  HCT 43.0 41.5 41.6  PLT 161 163 167   Recent Labs  Lab 11/20/18 1704 11/21/18 0226 11/22/18 0521  NA 140 142 142  K 4.1 4.3 4.3  CL 107 110 105  CO2 22 23 27   GLUCOSE 102* 119* 114*  BUN 12 12 12   CREATININE 1.11 0.96 1.08  CALCIUM 8.4* 8.6* 8.5*  MG  --  2.0  --   ALKPHOS 64  --   --   AST 54*  --   --   ALT 71*  --   --   ALBUMIN  3.3*  --   --     Ct Angio Chest Pe W And/or Wo Contrast  Result Date: 11/20/2018 CLINICAL DATA:  [place] to ground, unresponsive EXAM: CT ANGIOGRAPHY CHEST WITH CONTRAST TECHNIQUE: Multidetector CT imaging of the chest was performed using the standard protocol during bolus administration of intravenous contrast. Multiplanar CT image reconstructions and MIPs were obtained to evaluate the vascular anatomy. CONTRAST:  25mL OMNIPAQUE IOHEXOL 350 MG/ML SOLN COMPARISON:  None. FINDINGS: Cardiovascular: Positive examination for pulmonary embolism with a large  burden of subocclusive embolus present in the distal left and right pulmonary arteries and lobar to segmental branch vessels. Cardiomegaly. Enlargement of the RV LV ratio to 1.8. Three-vessel coronary artery calcifications. Enlargement of the main pulmonary artery to 3.3 cm. No pericardial effusion. Mediastinum/Nodes: No enlarged mediastinal, hilar, or axillary lymph nodes. Thyroid gland, trachea, and esophagus demonstrate no significant findings. Lungs/Pleura: Lungs are clear. No pleural effusion or pneumothorax. Upper Abdomen: No acute abnormality. Musculoskeletal: No chest wall abnormality. No acute or significant osseous findings. Review of the MIP images confirms the above findings. IMPRESSION: 1. Positive examination for pulmonary embolism with a large burden of subocclusive embolus present in the distal left and right pulmonary arteries and lobar to segmental branch vessels. 2. Cardiomegaly. Enlargement of the RV LV ratio to 1.8. Enlargement of the main pulmonary artery to 3.3 cm. Findings are concerning for right heart strain. Correlate with echocardiographic findings. 3.  Coronary artery disease. These results were called by telephone at the time of interpretation on 11/20/2018 at 7:58 pm to provider Dr. Sherril Croon, who verbally acknowledged these results. Electronically Signed   By: Eddie Candle M.D.   On: 11/20/2018 20:02   Dg Chest Portable 1 View  Result Date: 11/20/2018 CLINICAL DATA:  Shortness of breath. Syncopal episode. EXAM: PORTABLE CHEST 1 VIEW COMPARISON:  05/13/2010 FINDINGS: The cardiac silhouette remains borderline enlarged. Interval mild prominence of the pulmonary vasculature and minimal prominence of the interstitial markings. No pleural fluid or Kerley lines. Thoracic spine degenerative changes. IMPRESSION: Interval mild pulmonary vascular congestion and minimal chronic interstitial lung disease. Electronically Signed   By: Claudie Revering M.D.   On: 11/20/2018 17:14    Results/Tests  Pending at Time of Discharge: N/A  Discharge Medications:  Allergies as of 11/22/2018      Reactions   Cefzil [cefprozil] Hives      Medication List    STOP taking these medications   aspirin EC 325 MG tablet   meclizine 25 MG tablet Commonly known as: ANTIVERT     TAKE these medications   acetaminophen 500 MG tablet Commonly known as: TYLENOL Take 1,000-1,500 mg by mouth every 6 (six) hours as needed for headache (pain).   apixaban 5 MG Tabs tablet Commonly known as: Eliquis Take 2 tablets (10 mg total) by mouth 2 (two) times daily for 11 doses.   apixaban 5 MG Tabs tablet Commonly known as: Eliquis Take 1 tablet (5 mg total) by mouth 2 (two) times daily. Start taking on: November 28, 2018   B-12 PO Take 1 tablet by mouth at bedtime.   FISH OIL PO Take 1 capsule by mouth at bedtime.   loratadine 10 MG tablet Commonly known as: CLARITIN Take 10 mg by mouth at bedtime as needed for allergies.   MUCINEX PO Take 1 tablet by mouth 2 (two) times daily as needed (cough).   multivitamin with minerals Tabs tablet Take 1 tablet by mouth at bedtime.   olmesartan 40 MG tablet Commonly  known as: BENICAR Please specify directions, refills and quantity What changed: See the new instructions.   rosuvastatin 20 MG tablet Commonly known as: CRESTOR Take 1 tablet (20 mg total) by mouth daily at 6 PM.   VITAMIN C PO Take 1 tablet by mouth at bedtime.   VITAMIN D3 PO Take 1 tablet by mouth at bedtime.   VITAMIN E PO Take 1 capsule by mouth at bedtime.   ZINC PO Take 1 tablet by mouth at bedtime.            Durable Medical Equipment  (From admission, onward)         Start     Ordered   11/22/18 1205  For home use only DME oxygen  Once    Question Answer Comment  Length of Need 6 Months   Mode or (Route) Nasal cannula   Liters per Minute 2   Frequency Continuous (stationary and portable oxygen unit needed)   Oxygen delivery system Gas      11/22/18  1204          Discharge Instructions: Please refer to Patient Instructions section of EMR for full details.  Patient was counseled important signs and symptoms that should prompt return to medical care, changes in medications, dietary instructions, activity restrictions, and follow up appointments.    Gifford Shave, MD 11/22/2018, 1:28 PM PGY-1, [place] Upper-Level Resident Addendum   I have independently interviewed and examined the patient. I have discussed the above with the original author and agree with their documentation. My edits for correction/addition/clarification are in blue. Please see also any attending notes.    Milus Banister, DO PGY-2, [facility] 11/22/2018 2:25 PM  FPTS Service pager: [phone] (text pages welcome through [facility])

## 2018-11-22 NOTE — Progress Notes (Signed)
RN gave pt discharge instructions and he stated understanding, prescriptions dropped off by [facility] pharmacy. Oxygen at bedside for home use. Pt IV has been removed no further questions.

## 2018-11-22 NOTE — Progress Notes (Signed)
I was called to the patient's room because he had had another syncopal episode. Patient reportedly started coughing and coughed until he "passed out". Patient immediately regain consciousness and was alert and oriented. Patient son describes this as what occurred previously and this is now the third occurrence. Patient's description sounds like vasovagal response. Patient was alert and oriented on exam and in no distress. Patient was co unseled on breathing techniques as well as anti-tussive medications. Patient reports that this never happens when his nose is clear and that he uses a Netty pot to help keep his sinuses open. Patient encouraged to continue to use Netty pot as well as Flonase. Patient also talk to about the use of dextromethorphan as well as honey for it's antitussives properties.

## 2018-11-22 NOTE — Progress Notes (Signed)
Pt son called RN into room and stated that " pt had a coughing spell and then blacked out on the bed and came back to" when RN entered the room pt was sitting himself up on the bed and catching his breath. O2 93 on 2L. RN notified Family Medicine

## 2018-12-13 ENCOUNTER — Other Ambulatory Visit: Payer: Self-pay

## 2018-12-13 ENCOUNTER — Ambulatory Visit: Payer: 59 | Admitting: Internal Medicine

## 2018-12-13 ENCOUNTER — Encounter: Payer: Self-pay | Admitting: Internal Medicine

## 2018-12-13 DIAGNOSIS — J9601 Acute respiratory failure with hypoxia: Secondary | ICD-10-CM | POA: Diagnosis not present

## 2018-12-13 DIAGNOSIS — I2699 Other pulmonary embolism without acute cor pulmonale: Secondary | ICD-10-CM | POA: Diagnosis not present

## 2018-12-13 DIAGNOSIS — R053 Chronic cough: Secondary | ICD-10-CM

## 2018-12-13 DIAGNOSIS — R05 Cough: Secondary | ICD-10-CM

## 2018-12-13 NOTE — Assessment & Plan Note (Signed)
In setting of PE with large clot burden 11/20/2018 - 12/13/2018   Walked RA x two laps =  approx 533ft @ avg pace - stopped due to end of study/no sob with sats of 90% at the end of the study > rec d/c 02   His wt puts him at risk of OHS/ poor ventilation of bases and of course recurrent pe but not need for prophylactic 02 in this setting.  >>> f/u pulmonary prn

## 2018-12-13 NOTE — Assessment & Plan Note (Addendum)
Onset around 2004 assoc with nasal congestion/ pnds and ? Cough syncope x 2 events - referred to [facility] 12/13/2018   Upper airway cough syndrome (previously labeled PNDS),  is so named because it's frequently impossible to sort out how much is  CR/sinusitis with freq throat clearing (which can be related to primary GERD)   vs  causing  secondary (" extra esophageal")  GERD from wide swings in gastric pressure that occur with throat clearing, often  promoting self use of mint and menthol lozenges that reduce the lower esophageal sphincter tone and exacerbate the problem further in a cyclical fashion.   These are the same pts (now being labeled as having "irritable larynx syndrome" by some cough centers) who not infrequently have a history of having failed to tolerate ace inhibitors,  dry powder inhalers or biphosphonates or report having atypical/extraesophageal reflux symptoms that don't respond to standard doses of PPI  and are easily confused as having aecopd or asthma flares by even experienced allergists/ pulmonologists (myself included).    The cough can be so severe he feels like passing out but that's only happened twice and not related to the syncope he experienced at dx of PE but clearly from upper airway issues and since has assoc chronic nasal obst reasonable to let ent eval and refer to allergy prn    >>> pulmonary f/u is prn     Total time devoted to counseling  > 50 % of initial 60 min office visit:  reviewed case with pt/  directly observed portions of ambulatory 02 saturation study/ discussion of options/alternatives/ personally creating written customized instructions  in presence of pt  then going over those specific  Instructions directly with the pt including how to use all of the meds but in particular covering each new medication in detail and the difference between the maintenance= "automatic" meds and the prns using an action plan format for the latter (If this  problem/symptom => do that organization reading Left to right).  Please see AVS from this visit for a full list of these instructions which I personally wrote for this pt and  are unique to this visit.

## 2018-12-13 NOTE — Patient Instructions (Addendum)
Continue your nasal medications as you are and if not satisfied you will need to be referred to ENT or allergy  You had a very serious blood clot likely related to your previous DVT on L side   It is much more likely the dizzy spells are related to your blood pressure  Or inner ear problem and  not recurrent blood clots  Weight control is simply a matter of calorie balance which needs to be tilted in your favor by eating less and exercising more.  To get the most out of exercise, you need to be continuously aware that you are short of breath, but never out of breath, for 30 minutes daily. As you improve, it will actually be easier for you to do the same amount of exercise  in  30 minutes so always push to the level where you are short of breath.  If this does not result in gradual weight reduction then I strongly recommend you see a nutritionist with a food diary x 2 weeks so that we can work out a negative calorie balance which is universally effective in steady weight loss programs.  Think of your calorie balance like you do your bank account where in this case you want the balance to go down so you must take in less calories than you burn up.  It's just that simple:  Hard to do, but easy to understand.  Good luck!   Pulmonary follow up is as needed

## 2018-12-13 NOTE — Progress Notes (Signed)
Cody Romero, male    DOB: 01-18-1950     MRN: [id number]   Brief patient profile:  68 yowm never smoker   1999 dx LLE blood clot rx x 6 m with min  swellig/pain since then and pnds around 2004 while  Living in  [place] but no impact on ex tol then intermittent dizziness  then syncope > PE  Dx referred to pulmonary clinic 12/13/2018 by Dr   Gwendlyn Deutscher for PE and cough with d/c note as follows:   FMTS ATTENDING NOTE Kehinde Eniola,MD I  have seen and examined this patient, reviewed their chart. I have discussed this patient with the resident.     The patient denies any concern today. He is breathing better. His coughing remains an issue which is chronic for him. He is still requiring an O2 supplement but has improved since yesterday. Physical exam, in particular respiratory, is otherwise benign.  PE: Now on Eliquis. He will need lifelong anticoagulants due to hx of DVT, hx of melanoma, and obesity. Home oxygen is recommended with close follow-up with PCP and pulmonologist.  Chronic cough: Etiology unclear. I have discussed outpatient evaluation by a pulmonologist with him, and he agreed with the plan.  Upon reviewing the record, I noticed that the nurse had discussed with the residents about him blacking out after a coughing spell.  Per the resident (Dr. Clayton Bibles), they evaluated him, and he was at baseline. They will put in a progress note regarding this evaluation. At the time I saw the note, the patient had already been discharged. I called to follow-up with him. He stated that his coughing spells get bad enough sometimes that he will feel like passing out. He felt he blacked out this time around for a few seconds because he could not catch his breath. This sounds like a vaso-vagal spell. Over the telephone, he sounded well. I did advise him to return if this occurs again or he feels unwell. He verbalized understanding and agreed with the plan.  He has f/u appointment scheduled  with his PCP on 66/20/19.     Expand All Collapse All       Date of birth: [birth year]          Age: 68 y.o.    Gender: male Date of Admission: 11/20/2018                    Date of Discharge: 11/22/2018 Admitting Physician: Kinnie Feil, MD     Discharge Diagnoses/Problem List:  Pulmonary embolism Chronic cough Dizziness Hypertension Hyperlipidemia BPH Seasonal allergies History of melanoma Gout  Disposition: Home   Discharge Condition: Stable  Discharge Exam:  General:Alert, well-appearing, no acute distress Cardiovascular:Tachycardic rate but regular rhythm Respiratory:Clear to auscultation bilaterally, no increased work of breathing.   Home 1.5 L O2 when I enter the room.  He is satting in the mid 90s.  I cut the O2 off and his saturation remains in the mid to low 90s.  He sits up and his oxygen saturation is in the low 90s but he is not increasing work of breathing.  He denies any shortness of breath Abdomen:Soft, nontender, positive bowel sounds Extremities:No lower extremity edema or erythema  Brief Hospital Course:  Patient initially presented to the hospital after fainting at home which was witnessed by his son.  The son stated that he turned blue.  He was brought to the emergency room and evaluated for syncope.  Upon evaluation is found to  have a pulmonary embolus started on heparin.  On day 2 (11/16) of hospitalization he was transitioned from heparin drip to p.o. Eliquis.  He was trialed with a walking challenge and his oxygen saturation dropped down to the mid 80s.  He was kept overnight and continued on between 1.5 and 2.5 L of O2.  On evaluation the morning of discharge he was stable.  He had no shortness of breath.  When trialed on room air his oxygen saturation stayed in the 90 percent range.  Patient was given another walking trial which resulted in O2 sats ranging between 87 and 93.  He was stable for discharge and discharged home with return  precautions and instructed to meet with his primary care in the next week for O2 sat check.  Issues for Follow Up:  1. Check O2 saturation. 2. Eliquis dosing and schedule. 3. Evaluate to determine if he needs further continuation of Crestor. 4. Chronic cough, consider other allergy treatment such as Flonase or Singulair. 5. Recommend outpatient referral for pulmonary for PFT and possible sleep study. 6. Patient can take honey 1 tablespoon 3 times daily to supress her cough. Patient should use his netty pot daily, followed by flonase to each nostril to decrease his chronic cough. Patient should also stay on a proton-pump inhibitor such as Omeprazole to decrease acid reflux as this can also cause cough.            History of Present Illness  12/13/2018  Pulmonary/ 1st office eval/[facility]  "I'm fine"  Chief Complaint  Patient presents with   Pulmonary Consult    Referred by Dr. Andrena Mews- recent hospital admission 11/20/18 with acute respiratory failure. He states he was d/c'ed on o2 but has not been using it. He c/o cough with clear sputum for several years.  Dyspnea: walking up to a mile s stopping power pace, flat track  Cough: daily since 50's esp in am sporadic but much better since rx with flonase/ nasal saline  Sleep: on side /bed is horizontal / one pillow  SABA use: none  Dizzy spells are positional historically, room spinning with nausea, always better p lies down for a while. Thinks he may have seen ent previously about this but not in [place].   No obvious day to day or daytime variability or assoc purulent sputum or mucus plugs or hemoptysis or cp or chest tightness, subjective wheeze or overt sinus or hb symptoms.   Sleeping  without nocturnal  or early am exacerbation  of respiratory  c/o's or need for noct saba. Also denies any obvious fluctuation of symptoms with weather or environmental changes or other aggravating or alleviating factors except as outlined above   No  unusual exposure hx or h/o childhood pna/ asthma or knowledge of premature birth.  Current Allergies, Complete Past Medical History, Past Surgical History, Family History, and Social History were reviewed in [employer] record.  ROS  The following are not active complaints unless bolded Hoarseness, sore throat, dysphagia, dental problems, itching, sneezing,  nasal congestion or discharge of excess mucus/pnds  or purulent secretions, ear ache,   fever, chills, sweats, unintended wt loss or wt gain, classically pleuritic or exertional cp,  orthopnea pnd or arm/hand swelling  or leg swelling L >R  presyncope, palpitations, abdominal pain, anorexia, nausea, vomiting, diarrhea  or change in bowel habits or change in bladder habits, change in stools or change in urine, dysuria, hematuria,  rash, arthralgias, visual complaints, headache, numbness, weakness  or ataxia or problems with walking or coordination,  change in mood or  memory.          Past Medical History:  Diagnosis Date   BPH (benign prostatic hypertrophy)    Cancer (HCC)    Colon polyps    DVT (deep venous thrombosis) ([place]) 1999   6 months of coumadin   Gout    unconfirmed diagnosis   History of kidney stones    Hyperlipidemia    Hypertension    Melanoma in situ ([place])    Back    Outpatient Medications Prior to Visit  Medication Sig Dispense Refill   acetaminophen (TYLENOL) 500 MG tablet Take 1,000-1,500 mg by mouth every 6 (six) hours as needed for headache (pain).     apixaban (ELIQUIS) 5 MG TABS tablet Take 1 tablet (5 mg total) by mouth 2 (two) times daily. 60 tablet 0   Ascorbic Acid (VITAMIN C PO) Take 1 tablet by mouth at bedtime.      Cholecalciferol (VITAMIN D3 PO) Take 1 tablet by mouth at bedtime.      Cyanocobalamin (B-12 PO) Take 1 tablet by mouth at bedtime.      guaiFENesin (MUCINEX PO) Take 1 tablet by mouth 2 (two) times daily as needed (cough).     loratadine (CLARITIN) 10  MG tablet Take 10 mg by mouth at bedtime as needed for allergies.     Multiple Vitamin (MULTIVITAMIN WITH MINERALS) TABS tablet Take 1 tablet by mouth at bedtime.     Multiple Vitamins-Minerals (ZINC PO) Take 1 tablet by mouth at bedtime.      olmesartan (BENICAR) 40 MG tablet Please specify directions, refills and quantity (Patient taking differently: Take 40 mg by mouth at bedtime. ) 90 tablet 3   Omega-3 Fatty Acids (FISH OIL PO) Take 1 capsule by mouth at bedtime.      rosuvastatin (CRESTOR) 20 MG tablet Take 1 tablet (20 mg total) by mouth daily at 6 PM. 30 tablet 0   VITAMIN E PO Take 1 capsule by mouth at bedtime.     apixaban (ELIQUIS) 5 MG TABS tablet Take 2 tablets (10 mg total) by mouth 2 (two) times daily for 11 doses. 22 tablet 0      Objective:     BP 124/80 (BP Location: Left Arm, Cuff Size: Normal)    Pulse 77    Temp (!) 97.3 F (36.3 C) (Temporal)    Ht 5' 9.5" (1.765 m)    Wt 278 lb (126.1 kg)    SpO2 95% Comment: on RA   BMI 40.46 kg/m   SpO2: 95 %(on RA)   Obese wm challenging historian esp re "dizzy spells"    HEENT : pt wearing mask not removed for exam due to covid -19 concerns.    NECK :  without JVD/Nodes/TM/ nl carotid upstrokes bilaterally   LUNGS: no acc muscle use,  Nl contour chest which is clear to A and P bilaterally without cough on insp or exp maneuvers   CV:  RRR  no s3 or murmur or increase in P2, and no edema   ABD:  soft and nontender with nl inspiratory excursion in the supine position. No bruits or organomegaly appreciated, bowel sounds nl  MS:  Nl gait/ ext warm without deformities, calf tenderness, cyanosis or clubbing No obvious joint restrictions   SKIN: warm and dry without lesions    NEURO:  alert, approp, nl sensorium with  no motor or cerebellar deficits apparent.  I personally reviewed images and agree with radiology impression as follows:   Chest CTa 11/20/2018 1. Positive examination for pulmonary embolism  with a large burden of subocclusive embolus present in the distal left and right pulmonary arteries and lobar to segmental branch vessels.  2. Cardiomegaly. Enlargement of the RV LV ratio to 1.8. Enlargement of the main pulmonary artery to 3.3 cm. Findings are concerning for right heart strain. Correlate with echocardiographic findings.  3.  Coronary artery disease.     Assessment   Acute respiratory failure with hypoxia (HCC) In setting of PE with large clot burden 11/20/2018 - 12/13/2018   Walked RA x two laps =  approx 52ft @ avg pace - stopped due to end of study/no sob with sats of 90% at the end of the study > rec d/c 02   His wt puts him at risk of OHS/ poor ventilation of bases and of course recurrent pe but not need for prophylactic 02 in this setting.  >>> f/u pulmonary prn    Pulmonary embolism ([place]) Dx 11/20/18 with large burden and echo c/w mod enlarged RV > rec lifelong doac as had prior dvt 1999   Discussed in detail all the  indications, usual  risks and alternatives  relative to the benefits with patient who agrees to proceed with Rx as outlined.         Chronic cough Onset around 2004 assoc with nasal congestion/ pnds and ? Cough syncope x 2 events - [facility] 12/13/2018   Upper airway cough syndrome (previously labeled PNDS),  is so named because it's frequently impossible to sort out how much is  CR/sinusitis with freq throat clearing (which can be related to primary GERD)   vs  causing  secondary (" extra esophageal")  GERD from wide swings in gastric pressure that occur with throat clearing, often  promoting self use of mint and menthol lozenges that reduce the lower esophageal sphincter tone and exacerbate the problem further in a cyclical fashion.   These are the same pts (now being labeled as having "irritable larynx syndrome" by some cough centers) who not infrequently have a history of having failed to tolerate ace inhibitors,  dry  powder inhalers or biphosphonates or report having atypical/extraesophageal reflux symptoms that don't respond to standard doses of PPI  and are easily confused as having aecopd or asthma flares by even experienced allergists/ pulmonologists (myself included).    The cough can be so severe he feels like passing out but that's only happened twice and not related to the syncope he experienced at dx of PE but clearly from upper airway issues and since has assoc chronic nasal obst reasonable to let ent eval and refer to allergy prn    >>> pulmonary f/u is prn      Morbid obesity due to excess calories (HCC) Body mass index is 40.46 kg/m.  -  trending up  Lab Results  Component Value Date   TSH 3.77 09/21/2012     Contributing to dvt/re risk/ doe/hbp/ gerd (may be contributing to cough) reviewed the need and the process to achieve and maintain neg calorie balance > defer f/u primary care including intermittently monitoring thyroid status      Total time devoted to counseling  > 50 % of initial 60 min office visit:  reviewed case with pt/  directly observed portions of ambulatory 02 saturation study/ discussion of options/alternatives/ personally creating written customized instructions  in presence of pt  then going over those specific  Instructions directly with the pt including how to use all of the meds but in particular covering each new medication in detail and the difference between the maintenance= "automatic" meds and the prns using an action plan format for the latter (If this problem/symptom => do that organization reading Left to right).  Please see AVS from this visit for a full list of these instructions which I personally wrote for this pt and  are unique to this visit.   Christinia Gully, MD 12/13/2018

## 2018-12-13 NOTE — Assessment & Plan Note (Signed)
Body mass index is 40.46 kg/m.  -  trending up  Lab Results  Component Value Date   TSH 3.77 09/21/2012     Contributing to dvt/re risk/ doe/hbp/ gerd (may be contributing to cough) reviewed the need and the process to achieve and maintain neg calorie balance > defer f/u primary care including intermittently monitoring thyroid status

## 2018-12-13 NOTE — Assessment & Plan Note (Signed)
Dx 11/20/18 with large burden and echo c/w mod enlarged RV > rec lifelong doac as had prior dvt 1999   Discussed in detail all the  indications, usual  risks and alternatives  relative to the benefits with patient who agrees to proceed with Rx as outlined.

## 2018-12-16 ENCOUNTER — Telehealth: Payer: Self-pay | Admitting: Internal Medicine

## 2018-12-16 DIAGNOSIS — R053 Chronic cough: Secondary | ICD-10-CM

## 2018-12-16 DIAGNOSIS — R05 Cough: Secondary | ICD-10-CM

## 2018-12-16 NOTE — Telephone Encounter (Signed)
Called and spoke with pt letting him know the info stated by MW. Pt verbalized understanding. Order placed. Nothing further needed.

## 2018-12-16 NOTE — Telephone Encounter (Signed)
Spoke with the pt  He is asking about ENT referral  He states he wants referral so he can find out why he has had a cough for 10 years  Per last AVS:  Instructions  Continue your nasal medications as you are and if not satisfied you will need to be referred to ENT or allergy  You had a very serious blood clot likely related to your previous DVT on L side   It is much more likely the dizzy spells are related to your blood pressure  Or inner ear problem and  not recurrent blood clots  Weight control is simply a matter of calorie balance which needs to be tilted in your favor by eating less and exercising more.  To get the most out of exercise, you need to be continuously aware that you are short of breath, but never out of breath, for 30 minutes daily. As you improve, it will actually be easier for you to do the same amount of exercise  in  30 minutes so always push to the level where you are short of breath.  If this does not result in gradual weight reduction then I strongly recommend you see a nutritionist with a food diary x 2 weeks so that we can work out a negative calorie balance which is universally effective in steady weight loss programs.  Think of your calorie balance like you do your bank account where in this case you want the balance to go down so you must take in less calories than you burn up.  It's just that simple:  Hard to do, but easy to understand.  Good luck!   Pulmonary follow up is as needed      Please advise on referral- ENT or Allergy, thanks!

## 2018-12-16 NOTE — Telephone Encounter (Signed)
The best cough specialist is at [facility] Dr Joya Gaskins and fine with me if he doesn't mind travel  If wants to stay local, refer to [facility] group

## 2019-02-06 DIAGNOSIS — R490 Dysphonia: Secondary | ICD-10-CM | POA: Insufficient documentation

## 2019-02-27 ENCOUNTER — Ambulatory Visit: Payer: 59 | Admitting: Family Medicine

## 2019-02-27 ENCOUNTER — Other Ambulatory Visit: Payer: Self-pay

## 2019-02-27 ENCOUNTER — Encounter: Payer: Self-pay | Admitting: Family Medicine

## 2019-02-27 VITALS — BP 136/68 | HR 75 | Temp 98.4°F | Ht 60.75 in | Wt 276.0 lb

## 2019-02-27 DIAGNOSIS — E782 Mixed hyperlipidemia: Secondary | ICD-10-CM | POA: Diagnosis not present

## 2019-02-27 DIAGNOSIS — I10 Essential (primary) hypertension: Secondary | ICD-10-CM | POA: Diagnosis not present

## 2019-02-27 DIAGNOSIS — I2699 Other pulmonary embolism without acute cor pulmonale: Secondary | ICD-10-CM

## 2019-02-27 DIAGNOSIS — R05 Cough: Secondary | ICD-10-CM | POA: Diagnosis not present

## 2019-02-27 DIAGNOSIS — Z6841 Body Mass Index (BMI) 40.0 and over, adult: Secondary | ICD-10-CM

## 2019-02-27 DIAGNOSIS — R053 Chronic cough: Secondary | ICD-10-CM

## 2019-02-27 NOTE — Patient Instructions (Signed)
Start zyrtec. Stop loratadine. Continue other medications. Recommend work on diet, exercise, and weight loss.    DASH Eating Plan DASH stands for "Dietary Approaches to Stop Hypertension." The DASH eating plan is a healthy eating plan that has been shown to reduce high blood pressure (hypertension). It may also reduce your risk for type 2 diabetes, heart disease, and stroke. The DASH eating plan may also help with weight loss. What are tips for following this plan?  General guidelines  Avoid eating more than 2,300 mg (milligrams) of salt (sodium) a day. If you have hypertension, you may need to reduce your sodium intake to 1,500 mg a day.  Limit alcohol intake to no more than 1 drink a day for nonpregnant women and 2 drinks a day for men. One drink equals 12 oz of beer, 5 oz of wine, or 1 oz of hard liquor.  Work with your health care provider to maintain a healthy body weight or to lose weight. Ask what an ideal weight is for you.  Get at least 30 minutes of exercise that causes your heart to beat faster (aerobic exercise) most days of the week. Activities may include walking, swimming, or biking.  Work with your health care provider or diet and nutrition specialist (dietitian) to adjust your eating plan to your individual calorie needs. Reading food labels   Check food labels for the amount of sodium per serving. Choose foods with less than 5 percent of the Daily Value of sodium. Generally, foods with less than 300 mg of sodium per serving fit into this eating plan.  To find whole grains, look for the word "whole" as the first word in the ingredient list. Shopping  Buy products labeled as "low-sodium" or "no salt added."  Buy fresh foods. Avoid canned foods and premade or frozen meals. Cooking  Avoid adding salt when cooking. Use salt-free seasonings or herbs instead of table salt or sea salt. Check with your health care provider or pharmacist before using salt substitutes.  Do  not fry foods. Cook foods using healthy methods such as baking, boiling, grilling, and broiling instead.  Cook with heart-healthy oils, such as olive, canola, soybean, or sunflower oil. Meal planning  Eat a balanced diet that includes: ? 5 or more servings of fruits and vegetables each day. At each meal, try to fill half of your plate with fruits and vegetables. ? Up to 6-8 servings of whole grains each day. ? Less than 6 oz of lean meat, poultry, or fish each day. A 3-oz serving of meat is about the same size as a deck of cards. One egg equals 1 oz. ? 2 servings of low-fat dairy each day. ? A serving of nuts, seeds, or beans 5 times each week. ? Heart-healthy fats. Healthy fats called Omega-3 fatty acids are found in foods such as flaxseeds and coldwater fish, like sardines, salmon, and mackerel.  Limit how much you eat of the following: ? Canned or prepackaged foods. ? Food that is high in trans fat, such as fried foods. ? Food that is high in saturated fat, such as fatty meat. ? Sweets, desserts, sugary drinks, and other foods with added sugar. ? Full-fat dairy products.  Do not salt foods before eating.  Try to eat at least 2 vegetarian meals each week.  Eat more home-cooked food and less restaurant, buffet, and fast food.  When eating at a restaurant, ask that your food be prepared with less salt or no salt, if possible.  What foods are recommended? The items listed may not be a complete list. Talk with your dietitian about what dietary choices are best for you. Grains Whole-grain or whole-wheat bread. Whole-grain or whole-wheat pasta. Brown rice. [place]row. Bulgur. Whole-grain and low-sodium cereals. Pita bread. Low-fat, low-sodium crackers. Whole-wheat flour tortillas. Vegetables Fresh or frozen vegetables (raw, steamed, roasted, or grilled). Low-sodium or reduced-sodium tomato and vegetable juice. Low-sodium or reduced-sodium tomato sauce and tomato paste. Low-sodium or  reduced-sodium canned vegetables. Fruits All fresh, dried, or frozen fruit. Canned fruit in natural juice (without added sugar). Meat and other protein foods Skinless chicken or [place]. Ground chicken or [place]. Pork with fat trimmed off. Fish and seafood. Egg whites. Dried beans, peas, or lentils. Unsalted nuts, nut butters, and seeds. Unsalted canned beans. Lean cuts of beef with fat trimmed off. Low-sodium, lean deli meat. Dairy Low-fat (1%) or fat-free (skim) milk. Fat-free, low-fat, or reduced-fat cheeses. Nonfat, low-sodium ricotta or cottage cheese. Low-fat or nonfat yogurt. Low-fat, low-sodium cheese. Fats and oils Soft margarine without trans fats. Vegetable oil. Low-fat, reduced-fat, or light mayonnaise and salad dressings (reduced-sodium). Canola, safflower, olive, soybean, and sunflower oils. Avocado. Seasoning and other foods Herbs. Spices. Seasoning mixes without salt. Unsalted popcorn and pretzels. Fat-free sweets. What foods are not recommended? The items listed may not be a complete list. Talk with your dietitian about what dietary choices are best for you. Grains Baked goods made with fat, such as croissants, muffins, or some breads. Dry pasta or rice meal packs. Vegetables Creamed or fried vegetables. Vegetables in a cheese sauce. Regular canned vegetables (not low-sodium or reduced-sodium). Regular canned tomato sauce and paste (not low-sodium or reduced-sodium). Regular tomato and vegetable juice (not low-sodium or reduced-sodium). Angie Fava. Olives. Fruits Canned fruit in a light or heavy syrup. Fried fruit. Fruit in cream or butter sauce. Meat and other protein foods Fatty cuts of meat. Ribs. Fried meat. Berniece Salines. Sausage. Bologna and other processed lunch meats. Salami. Fatback. Hotdogs. Bratwurst. Salted nuts and seeds. Canned beans with added salt. Canned or smoked fish. Whole eggs or egg yolks. Chicken or [place] with skin. Dairy Whole or 2% milk, cream, and half-and-half.  Whole or full-fat cream cheese. Whole-fat or sweetened yogurt. Full-fat cheese. Nondairy creamers. Whipped toppings. Processed cheese and cheese spreads. Fats and oils Butter. Stick margarine. Lard. Shortening. Ghee. Bacon fat. Tropical oils, such as coconut, palm kernel, or palm oil. Seasoning and other foods Salted popcorn and pretzels. Onion salt, garlic salt, seasoned salt, table salt, and sea salt. Worcestershire sauce. Tartar sauce. Barbecue sauce. Teriyaki sauce. Soy sauce, including reduced-sodium. Steak sauce. Canned and packaged gravies. Fish sauce. Oyster sauce. Cocktail sauce. Horseradish that you find on the shelf. Ketchup. Mustard. Meat flavorings and tenderizers. Bouillon cubes. Hot sauce and Tabasco sauce. Premade or packaged marinades. Premade or packaged taco seasonings. Relishes. Regular salad dressings. Where to find more information:  National Heart, Lung, and [place]: https://wilson-eaton.com/  American Heart Association: www.heart.org Summary  The DASH eating plan is a healthy eating plan that has been shown to reduce high blood pressure (hypertension). It may also reduce your risk for type 2 diabetes, heart disease, and stroke.  With the DASH eating plan, you should limit salt (sodium) intake to 2,300 mg a day. If you have hypertension, you may need to reduce your sodium intake to 1,500 mg a day.  When on the DASH eating plan, aim to eat more fresh fruits and vegetables, whole grains, lean proteins, low-fat dairy, and heart-healthy fats.  Work  with your health care provider or diet and nutrition specialist (dietitian) to adjust your eating plan to your individual calorie needs. This information is not intended to replace advice given to you by your health care provider. Make sure you discuss any questions you have with your health care provider. Document Revised: 12/04/2016 Document Reviewed: 12/16/2015 Elsevier Patient Education  2020 [employer].

## 2019-02-27 NOTE — Progress Notes (Signed)
Subjective:  Patient ID: Cody Romero, male    DOB: 12/28/1950  Age: 69 y.o. MRN: [id number]  Chief Complaint  Patient presents with  . Hypertension  . Hyperlipidemia    HPI Patient is a 69 year old white male who presents for routine follow-up of hypertension, hyperlipidemia, morbid obesity, and pulmonary embolism (November/15/2020).  The patient is eating healthy and exercising.  He currently is taking Eliquis 5 mg twice daily for the pulmonary embolism.  The patient has a history of a DVT in 1998, so therefore I expect he will be on Eliquis permanently.  The patient's hypertension is treated with olmesartan 40 mg once daily.  His hyperlipidemia is treated with rosuvastatin 20 mg once daily.  Patient denies any chest pain or shortness of breath.  Social History   Socioeconomic History  . Marital status: Divorced    Spouse name: Not on file  . Number of children: 2  . Years of education: Not on file  . Highest education level: Not on file  Occupational History  . Occupation: Optician, dispensing    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  Tobacco Use  . Smoking status: Never Smoker  . Smokeless tobacco: Never Used  Substance and Sexual Activity  . Alcohol use: Not Currently  . Drug use: No  . Sexual activity: Not on file  Other Topics Concern  . Not on file  Social History Narrative   No living will   Would want son [name] to make decisions    Would want resuscitation   No tube feeds if cognitively unaware   Social Determinants of Health   Financial Resource Strain:   . Difficulty of Paying Living Expenses: Not on file  Food Insecurity:   . Worried About Charity fundraiser in the Last Year: Not on file  . Ran Out of Food in the Last Year: Not on file  Transportation Needs:   . Lack of Transportation (Medical): Not on file  . Lack of Transportation (Non-Medical): Not on file  Physical Activity:   . Days of Exercise per Week: Not on file  . Minutes of Exercise per Session: Not on file    Stress:   . Feeling of Stress : Not on file  Social Connections:   . Frequency of Communication with Friends and Family: Not on file  . Frequency of Social Gatherings with Friends and Family: Not on file  . Attends Religious Services: Not on file  . Active Member of Clubs or Organizations: Not on file  . Attends Archivist Meetings: Not on file  . Marital Status: Not on file   Past Medical History:  Diagnosis Date  . BPH (benign prostatic hypertrophy)   . Cancer ([place])   . Colon polyps   . DVT (deep venous thrombosis) (HCC) 1999   6 months of coumadin  . Gout    unconfirmed diagnosis  . History of kidney stones   . Hyperlipidemia   . Hypertension   . Melanoma in situ ([place])    Back   Family History  Problem Relation Age of Onset  . Diabetes Mother   . Stroke Mother   . Atrial fibrillation Mother   . Cancer Father   . Diabetes Sister   . Hypertension Brother   . Diabetes Brother   . Heart disease Maternal Grandfather        MI  . Heart disease Maternal Uncle   . Colon cancer Neg Hx   . Esophageal cancer Neg Hx   .  Liver cancer Neg Hx   . Pancreatic cancer Neg Hx   . Rectal cancer Neg Hx   . Stomach cancer Neg Hx     Review of Systems  Constitutional: Negative for chills and fever.  HENT: Positive for congestion and postnasal drip. Negative for ear pain and sore throat.   Respiratory: Negative for cough and shortness of breath.   Cardiovascular: Negative for chest pain.  Gastrointestinal: Negative for abdominal pain, constipation, diarrhea, nausea and vomiting.  Endocrine: Negative for polydipsia, polyphagia and polyuria.  Genitourinary: Negative for dysuria and frequency.  Musculoskeletal: Positive for arthralgias. Negative for myalgias.       Joint pain in Left shoulder.   Neurological: Negative for dizziness, numbness and headaches.  Psychiatric/Behavioral: Negative for dysphoric mood and sleep disturbance. The patient is not nervous/anxious.         No dysphoria     Objective:  BP 136/68 (BP Location: Left Arm, Patient Position: Sitting)   Pulse 75   Temp 98.4 F (36.9 C) (Temporal)   Ht 5' 0.75" (1.543 m)   Wt 276 lb (125.2 kg)   SpO2 97%   BMI 52.58 kg/m   BP/Weight 02/27/2019 12/13/2018 [id number]  Systolic BP [id number]  Diastolic BP 68 80 95  Wt. (Lbs) 276 278 -  BMI 52.58 40.46 -    Physical Exam Vitals reviewed.  Constitutional:      Appearance: Normal appearance.  HENT:     Right Ear: Tympanic membrane normal.     Left Ear: Tympanic membrane normal.     Nose: Congestion and rhinorrhea present.     Mouth/Throat:     Mouth: Mucous membranes are moist.     Pharynx: No oropharyngeal exudate or posterior oropharyngeal erythema.  Neck:     Vascular: No carotid bruit.  Cardiovascular:     Rate and Rhythm: Normal rate and regular rhythm.     Pulses: Normal pulses.     Heart sounds: Normal heart sounds.  Pulmonary:     Effort: Pulmonary effort is normal.     Breath sounds: Normal breath sounds. No wheezing, rhonchi or rales.  Abdominal:     General: Bowel sounds are normal.     Palpations: Abdomen is soft.     Tenderness: There is no abdominal tenderness.  Neurological:     Mental Status: He is alert.  Psychiatric:        Mood and Affect: Mood normal.        Behavior: Behavior normal.     Lab Results  Component Value Date   WBC 8.8 11/22/2018   HGB 13.7 11/22/2018   HCT 41.6 11/22/2018   PLT 167 11/22/2018   GLUCOSE 114 (H) 11/22/2018   CHOL 208 (H) 09/27/2018   TRIG 203.0 (H) 09/27/2018   HDL 29.90 (L) 09/27/2018   LDLDIRECT 143.0 09/27/2018   LDLCALC 141 (H) 09/16/2016   ALT 71 (H) 11/20/2018   AST 54 (H) 11/20/2018   NA 142 11/22/2018   K 4.3 11/22/2018   CL 105 11/22/2018   CREATININE 1.08 11/22/2018   BUN 12 11/22/2018   CO2 27 11/22/2018   TSH 3.77 09/21/2012   PSA 0.42 09/27/2018      Assessment & Plan:   No problem-specific Assessment & Plan notes found for this  encounter.   No orders of the defined types were placed in this encounter.      Follow-up: Return in about 3 months (around 05/27/2019) for fasting.  AVS was given to  patient prior to departure.  Rochel Brome [facility] [phone]

## 2019-02-28 ENCOUNTER — Other Ambulatory Visit: Payer: Self-pay | Admitting: Family Medicine

## 2019-02-28 DIAGNOSIS — R7989 Other specified abnormal findings of blood chemistry: Secondary | ICD-10-CM

## 2019-02-28 LAB — COMP. METABOLIC PANEL (12)
AST: 23 IU/L (ref 0–40)
Albumin/Globulin Ratio: 2 (ref 1.2–2.2)
Albumin: 4.3 g/dL (ref 3.8–4.8)
Alkaline Phosphatase: 72 IU/L (ref 39–117)
BUN/Creatinine Ratio: 11 (ref 10–24)
BUN: 13 mg/dL (ref 8–27)
Bilirubin Total: 0.4 mg/dL (ref 0.0–1.2)
Calcium: 9.3 mg/dL (ref 8.6–10.2)
Chloride: 104 mmol/L (ref 96–106)
Creatinine, Ser: 1.16 mg/dL (ref 0.76–1.27)
GFR calc Af Amer: 75 mL/min/{1.73_m2} (ref >59)
GFR calc non Af Amer: 65 mL/min/{1.73_m2} (ref >59)
Globulin, Total: 2.2 g/dL (ref 1.5–4.5)
Glucose: 97 mg/dL (ref 65–99)
Potassium: 4.6 mmol/L (ref 3.5–5.2)
Sodium: 141 mmol/L (ref 134–144)
Total Protein: 6.5 g/dL (ref 6.0–8.5)

## 2019-02-28 LAB — CBC WITH DIFFERENTIAL/PLATELET
Basophils Absolute: 0.1 10*3/uL (ref 0.0–0.2)
Basos: 1 %
EOS (ABSOLUTE): 0.1 10*3/uL (ref 0.0–0.4)
Eos: 1 %
Hematocrit: 45 % (ref 37.5–51.0)
Hemoglobin: 15 g/dL (ref 13.0–17.7)
Immature Grans (Abs): 0 10*3/uL (ref 0.0–0.1)
Immature Granulocytes: 0 %
Lymphocytes Absolute: 2 10*3/uL (ref 0.7–3.1)
Lymphs: 32 %
MCH: 31.9 pg (ref 26.6–33.0)
MCHC: 33.3 g/dL (ref 31.5–35.7)
MCV: 96 fL (ref 79–97)
Monocytes Absolute: 0.6 10*3/uL (ref 0.1–0.9)
Monocytes: 10 %
Neutrophils Absolute: 3.5 10*3/uL (ref 1.4–7.0)
Neutrophils: 56 %
Platelets: 259 10*3/uL (ref 150–450)
RBC: 4.7 x10E6/uL (ref 4.14–5.80)
RDW: 12.6 % (ref 11.6–15.4)
WBC: 6.2 10*3/uL (ref 3.4–10.8)

## 2019-02-28 LAB — LIPID PANEL
Chol/HDL Ratio: 3.8 ratio (ref 0.0–5.0)
Cholesterol, Total: 121 mg/dL (ref 100–199)
HDL: 32 mg/dL — ABNORMAL LOW (ref >39)
LDL Chol Calc (NIH): 68 mg/dL (ref 0–99)
Triglycerides: 117 mg/dL (ref 0–149)
VLDL Cholesterol Cal: 21 mg/dL (ref 5–40)

## 2019-02-28 LAB — TSH: TSH: 7.17 u[IU]/mL — ABNORMAL HIGH (ref 0.450–4.500)

## 2019-02-28 LAB — CARDIOVASCULAR RISK ASSESSMENT

## 2019-03-09 DIAGNOSIS — Z6841 Body Mass Index (BMI) 40.0 and over, adult: Secondary | ICD-10-CM | POA: Insufficient documentation

## 2019-03-09 HISTORY — DX: Body Mass Index (BMI) 40.0 and over, adult: Z684

## 2019-03-09 NOTE — Assessment & Plan Note (Signed)
Well controlled. No changes to medicines.  Continue to work on eating a healthy diet and exercise.    

## 2019-03-09 NOTE — Assessment & Plan Note (Signed)
Well controlled.  ?No changes to medicines.  ?Continue to work on eating a healthy diet and exercise.  ?Labs drawn today.  ?

## 2019-03-22 ENCOUNTER — Other Ambulatory Visit: Payer: Self-pay | Admitting: Family Medicine

## 2019-03-22 LAB — SPECIMEN STATUS REPORT

## 2019-03-22 LAB — T4, FREE: Free T4: 0.7 ng/dL — ABNORMAL LOW (ref 0.82–1.77)

## 2019-03-23 ENCOUNTER — Other Ambulatory Visit: Payer: Self-pay

## 2019-03-23 MED ORDER — LEVOTHYROXINE SODIUM 50 MCG PO TABS: 25.0000 ug | ORAL_TABLET | Freq: Every day | ORAL | 1 refills | Status: DC

## 2019-04-11 ENCOUNTER — Encounter: Payer: Self-pay | Admitting: Family Medicine

## 2019-04-11 DIAGNOSIS — R7989 Other specified abnormal findings of blood chemistry: Secondary | ICD-10-CM

## 2019-04-11 HISTORY — DX: Other specified abnormal findings of blood chemistry: R79.89

## 2019-05-19 ENCOUNTER — Other Ambulatory Visit: Payer: Self-pay | Admitting: Physician Assistant

## 2019-05-21 ENCOUNTER — Other Ambulatory Visit: Payer: Self-pay | Admitting: Family Medicine

## 2019-05-30 ENCOUNTER — Encounter: Payer: Self-pay | Admitting: Family Medicine

## 2019-05-30 ENCOUNTER — Other Ambulatory Visit: Payer: Self-pay

## 2019-05-30 ENCOUNTER — Ambulatory Visit: Payer: 59 | Admitting: Family Medicine

## 2019-05-30 VITALS — BP 110/70 | HR 80 | Temp 97.1°F | Resp 18 | Ht 61.0 in | Wt 276.0 lb

## 2019-05-30 DIAGNOSIS — I10 Essential (primary) hypertension: Secondary | ICD-10-CM | POA: Diagnosis not present

## 2019-05-30 DIAGNOSIS — E782 Mixed hyperlipidemia: Secondary | ICD-10-CM

## 2019-05-30 DIAGNOSIS — Z86711 Personal history of pulmonary embolism: Secondary | ICD-10-CM | POA: Diagnosis not present

## 2019-05-30 DIAGNOSIS — E039 Hypothyroidism, unspecified: Secondary | ICD-10-CM

## 2019-05-30 DIAGNOSIS — Z6841 Body Mass Index (BMI) 40.0 and over, adult: Secondary | ICD-10-CM

## 2019-05-30 DIAGNOSIS — Z85820 Personal history of malignant melanoma of skin: Secondary | ICD-10-CM
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
## 2019-05-30 NOTE — Patient Instructions (Signed)
Call [place] Patient for CPAP machine [phone]

## 2019-05-31 LAB — CBC WITH DIFFERENTIAL/PLATELET
Basophils Absolute: 0.1 10*3/uL (ref 0.0–0.2)
Basos: 1 %
EOS (ABSOLUTE): 0.1 10*3/uL (ref 0.0–0.4)
Eos: 1 %
Hematocrit: 41.4 % (ref 37.5–51.0)
Hemoglobin: 14.4 g/dL (ref 13.0–17.7)
Immature Grans (Abs): 0 10*3/uL (ref 0.0–0.1)
Immature Granulocytes: 0 %
Lymphocytes Absolute: 2.6 10*3/uL (ref 0.7–3.1)
Lymphs: 39 %
MCH: 32.9 pg (ref 26.6–33.0)
MCHC: 34.8 g/dL (ref 31.5–35.7)
MCV: 95 fL (ref 79–97)
Monocytes Absolute: 0.6 10*3/uL (ref 0.1–0.9)
Monocytes: 10 %
Neutrophils Absolute: 3.4 10*3/uL (ref 1.4–7.0)
Neutrophils: 49 %
Platelets: 237 10*3/uL (ref 150–450)
RBC: 4.38 x10E6/uL (ref 4.14–5.80)
RDW: 13.1 % (ref 11.6–15.4)
WBC: 6.7 10*3/uL (ref 3.4–10.8)

## 2019-05-31 LAB — COMPREHENSIVE METABOLIC PANEL
ALT: 23 IU/L (ref 0–44)
AST: 24 IU/L (ref 0–40)
Albumin/Globulin Ratio: 1.6 (ref 1.2–2.2)
Albumin: 4.1 g/dL (ref 3.8–4.8)
Alkaline Phosphatase: 72 IU/L (ref 48–121)
BUN/Creatinine Ratio: 12 (ref 10–24)
BUN: 11 mg/dL (ref 8–27)
Bilirubin Total: 0.7 mg/dL (ref 0.0–1.2)
CO2: 26 mmol/L (ref 20–29)
Calcium: 9.3 mg/dL (ref 8.6–10.2)
Chloride: 102 mmol/L (ref 96–106)
Creatinine, Ser: 0.95 mg/dL (ref 0.76–1.27)
GFR calc Af Amer: 96 mL/min/{1.73_m2} (ref >59)
GFR calc non Af Amer: 83 mL/min/{1.73_m2} (ref >59)
Globulin, Total: 2.6 g/dL (ref 1.5–4.5)
Glucose: 83 mg/dL (ref 65–99)
Potassium: 4.4 mmol/L (ref 3.5–5.2)
Sodium: 142 mmol/L (ref 134–144)
Total Protein: 6.7 g/dL (ref 6.0–8.5)

## 2019-05-31 LAB — LIPID PANEL
Chol/HDL Ratio: 3.6 ratio (ref 0.0–5.0)
Cholesterol, Total: 120 mg/dL (ref 100–199)
HDL: 33 mg/dL — ABNORMAL LOW (ref >39)
LDL Chol Calc (NIH): 62 mg/dL (ref 0–99)
Triglycerides: 143 mg/dL (ref 0–149)
VLDL Cholesterol Cal: 25 mg/dL (ref 5–40)

## 2019-05-31 LAB — T4, FREE: Free T4: 0.84 ng/dL (ref 0.82–1.77)

## 2019-05-31 LAB — TSH: TSH: 7.2 u[IU]/mL — ABNORMAL HIGH (ref 0.450–4.500)

## 2019-05-31 LAB — CARDIOVASCULAR RISK ASSESSMENT

## 2019-06-01 ENCOUNTER — Other Ambulatory Visit: Payer: Self-pay

## 2019-06-01 MED ORDER — LEVOTHYROXINE SODIUM 50 MCG PO TABS: 50.0000 ug | ORAL_TABLET | Freq: Every day | ORAL | 1 refills | Status: DC

## 2019-06-01 NOTE — Telephone Encounter (Signed)
Cody Romero called back to report that he has been taking levothyroxine 25 mcg daily.  He request that RX be sent to [facility].

## 2019-06-01 NOTE — Telephone Encounter (Signed)
Cody Romero called to report that he has been taking synthroid 25 mcg daily.  Dosage discussed with Marge Duncans, PA and she advised that he increase to 50 mcg daily.

## 2019-06-08 ENCOUNTER — Other Ambulatory Visit: Payer: Self-pay

## 2019-06-08 DIAGNOSIS — Z86711 Personal history of pulmonary embolism: Secondary | ICD-10-CM

## 2019-06-08 HISTORY — DX: Personal history of pulmonary embolism: Z86.711

## 2019-06-19 ENCOUNTER — Other Ambulatory Visit: Payer: Self-pay | Admitting: Physician Assistant

## 2019-06-21 ENCOUNTER — Other Ambulatory Visit: Payer: Self-pay

## 2019-06-21 MED ORDER — APIXABAN 5 MG PO TABS: 5.0000 mg | ORAL_TABLET | Freq: Two times a day (BID) | ORAL | 3 refills | Status: DC

## 2019-06-22 ENCOUNTER — Other Ambulatory Visit: Payer: Self-pay

## 2019-06-22 ENCOUNTER — Encounter: Payer: Self-pay | Admitting: Family Medicine

## 2019-06-22 ENCOUNTER — Ambulatory Visit (INDEPENDENT_AMBULATORY_CARE_PROVIDER_SITE_OTHER): Payer: 59 | Admitting: Family Medicine

## 2019-06-22 VITALS — BP 136/78 | HR 73 | Temp 97.3°F | Ht 70.0 in | Wt 276.0 lb

## 2019-06-22 DIAGNOSIS — Z6841 Body Mass Index (BMI) 40.0 and over, adult: Secondary | ICD-10-CM

## 2019-06-22 DIAGNOSIS — M940 Chondrocostal junction syndrome [Tietze]: Secondary | ICD-10-CM

## 2019-06-22 MED ORDER — ROSUVASTATIN CALCIUM 20 MG PO TABS: 20.0000 mg | ORAL_TABLET | Freq: Every day | ORAL | 0 refills | Status: DC

## 2019-06-22 NOTE — Patient Instructions (Addendum)
Co Healthy Eating Following a healthy eating pattern may help you to achieve and maintain a healthy body weight, reduce the risk of chronic disease, and live a long and productive life. It is important to follow a healthy eating pattern at an appropriate calorie level for your body. Your nutritional needs should be met primarily through food by choosing a variety of nutrient-rich foods. What are tips for following this plan? Reading food labels  Read labels and choose the following: ? Reduced or low sodium. ? Juices with 100% fruit juice. ? Foods with low saturated fats and high polyunsaturated and monounsaturated fats. ? Foods with whole grains, such as whole wheat, cracked wheat, brown rice, and wild rice. ? Whole grains that are fortified with folic acid. This is recommended for women who are pregnant or who want to become pregnant.  Read labels and avoid the following: ? Foods with a lot of added sugars. These include foods that contain brown sugar, corn sweetener, corn syrup, dextrose, fructose, glucose, high-fructose corn syrup, honey, invert sugar, lactose, malt syrup, maltose, molasses, raw sugar, sucrose, trehalose, or turbinado sugar.  Do not eat more than the following amounts of added sugar per day:  6 teaspoons (25 g) for women.  9 teaspoons (38 g) for men. ? Foods that contain processed or refined starches and grains. ? Refined grain products, such as white flour, degermed cornmeal, white bread, and white rice. Shopping  Choose nutrient-rich snacks, such as vegetables, whole fruits, and nuts. Avoid high-calorie and high-sugar snacks, such as potato chips, fruit snacks, and candy.  Use oil-based dressings and spreads on foods instead of solid fats such as butter, stick margarine, or cream cheese.  Limit pre-made sauces, mixes, and "instant" products such as flavored rice, instant noodles, and ready-made pasta.  Try more plant-protein sources, such as tofu, tempeh, black  beans, edamame, lentils, nuts, and seeds.  Explore eating plans such as the Mediterranean diet or vegetarian diet. Cooking  Use oil to saut or stir-fry foods instead of solid fats such as butter, stick margarine, or lard.  Try baking, boiling, grilling, or broiling instead of frying.  Remove the fatty part of meats before cooking.  Steam vegetables in water or broth. Meal planning   At meals, imagine dividing your plate into fourths: ? One-half of your plate is fruits and vegetables. ? One-fourth of your plate is whole grains. ? One-fourth of your plate is protein, especially lean meats, poultry, eggs, tofu, beans, or nuts.  Include low-fat dairy as part of your daily diet. Lifestyle  Choose healthy options in all settings, including home, work, school, restaurants, or stores.  Prepare your food safely: ? Wash your hands after handling raw meats. ? Keep food preparation surfaces clean by regularly washing with hot, soapy water. ? Keep raw meats separate from ready-to-eat foods, such as fruits and vegetables. ? Cook seafood, meat, poultry, and eggs to the recommended internal temperature. ? Store foods at safe temperatures. In general:  Keep cold foods at 64F (4.4C) or below.  Keep hot foods at 164F (60C) or above.  Keep your freezer at [facility] (-17.8C) or below.  Foods are no longer safe to eat when they have been between the temperatures of 40-164F (4.4-60C) for more than 2 hours. What foods should I eat? Fruits Aim to eat 2 cup-equivalents of fresh, canned (in natural juice), or frozen fruits each day. Examples of 1 cup-equivalent of fruit include 1 small apple, 8 large strawberries, 1 cup canned fruit,  cup dried fruit, or 1 cup 100% juice. Vegetables Aim to eat 2-3 cup-equivalents of fresh and frozen vegetables each day, including different varieties and colors. Examples of 1 cup-equivalent of vegetables include 2 medium carrots, 2 cups raw, leafy greens, 1 cup  chopped vegetable (raw or cooked), or 1 medium baked potato. Grains Aim to eat 6 ounce-equivalents of whole grains each day. Examples of 1 ounce-equivalent of grains include 1 slice of bread, 1 cup ready-to-eat cereal, 3 cups popcorn, or  cup cooked rice, pasta, or cereal. Meats and other proteins Aim to eat 5-6 ounce-equivalents of protein each day. Examples of 1 ounce-equivalent of protein include 1 egg, 1/2 cup nuts or seeds, or 1 tablespoon (16 g) peanut butter. A cut of meat or fish that is the size of a deck of cards is about 3-4 ounce-equivalents.  Of the protein you eat each week, try to have at least 8 ounces come from seafood. This includes salmon, trout, herring, and anchovies. Dairy Aim to eat 3 cup-equivalents of fat-free or low-fat dairy each day. Examples of 1 cup-equivalent of dairy include 1 cup (240 mL) milk, 8 ounces (250 g) yogurt, 1 ounces (44 g) natural cheese, or 1 cup (240 mL) fortified soy milk. Fats and oils  Aim for about 5 teaspoons (21 g) per day. Choose monounsaturated fats, such as canola and olive oils, avocados, peanut butter, and most nuts, or polyunsaturated fats, such as sunflower, corn, and soybean oils, walnuts, pine nuts, sesame seeds, sunflower seeds, and flaxseed. Beverages  Aim for six 8-oz glasses of water per day. Limit coffee to three to five 8-oz cups per day.  Limit caffeinated beverages that have added calories, such as soda and energy drinks.  Limit alcohol intake to no more than 1 drink a day for nonpregnant women and 2 drinks a day for men. One drink equals 12 oz of beer (355 mL), 5 oz of wine (148 mL), or 1 oz of hard liquor (44 mL). Seasoning and other foods  Avoid adding excess amounts of salt to your foods. Try flavoring foods with herbs and spices instead of salt.  Avoid adding sugar to foods.  Try using oil-based dressings, sauces, and spreads instead of solid fats. This information is based on general [place] nutrition guidelines.  For more information, visit [employer]. Exact amounts may vary based on your nutrition needs. Summary  A healthy eating plan may help you to maintain a healthy weight, reduce the risk of chronic diseases, and stay active throughout your life.  Plan your meals. Make sure you eat the right portions of a variety of nutrient-rich foods.  Try baking, boiling, grilling, or broiling instead of frying.  Choose healthy options in all settings, including home, work, school, restaurants, or stores. This information is not intended to replace advice given to you by your health care provider. Make sure you discuss any questions you have with your health care provider. Document Revised: 04/05/2017 Document Reviewed: 04/05/2017 Elsevier Patient Education  [place]. stochondritis Costochondritis is swelling and irritation (inflammation) of the tissue (cartilage) that connects your ribs to your breastbone (sternum). This causes pain in the front of your chest. Usually, the pain:  Starts gradually.  Is in more than one rib. This condition usually goes away on its own over time. Follow these instructions at home:  Do not do anything that makes your pain worse.  If directed, put ice on the painful area: ? Put ice in a plastic bag. ? Place a  towel between your skin and the bag. ? Leave the ice on for 20 minutes, 2-3 times a day.  If directed, put heat on the affected area as often as told by your doctor. Use the heat source that your doctor tells you to use, such as a moist heat pack or a heating pad. ? Place a towel between your skin and the heat source. ? Leave the heat on for 20-30 minutes. ? Take off the heat if your skin turns bright red. This is very important if you cannot feel pain, heat, or cold. You may have a greater risk of getting burned.  Take over-the-counter and prescription medicines only as told by your doctor.  Return to your normal activities as told by your  doctor. Ask your doctor what activities are safe for you.  Keep all follow-up visits as told by your doctor. This is important. Contact a doctor if:  You have chills or a fever.  Your pain does not go away or it gets worse.  You have a cough that does not go away. Get help right away if:  You are short of breath. This information is not intended to replace advice given to you by your health care provider. Make sure you discuss any questions you have with your health care provider. Document Revised: 01/06/2017 Document Reviewed: 04/17/2015 Elsevier Patient Education  2020 [employer].
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
## 2019-07-14 ENCOUNTER — Other Ambulatory Visit: Payer: Self-pay | Admitting: Family Medicine

## 2019-07-14 ENCOUNTER — Other Ambulatory Visit: Payer: Self-pay | Admitting: Internal Medicine

## 2019-07-14 ENCOUNTER — Other Ambulatory Visit: Payer: Self-pay | Admitting: Physician Assistant

## 2019-07-17 ENCOUNTER — Other Ambulatory Visit: Payer: Self-pay

## 2019-07-17 MED ORDER — OLMESARTAN MEDOXOMIL 40 MG PO TABS: 40.0000 mg | ORAL_TABLET | Freq: Every day | ORAL | 0 refills | Status: DC

## 2019-08-31 ENCOUNTER — Other Ambulatory Visit: Payer: Self-pay

## 2019-08-31 ENCOUNTER — Ambulatory Visit (INDEPENDENT_AMBULATORY_CARE_PROVIDER_SITE_OTHER): Payer: 59 | Admitting: Family Medicine

## 2019-08-31 ENCOUNTER — Encounter: Payer: Self-pay | Admitting: Family Medicine

## 2019-08-31 VITALS — BP 110/70 | HR 80 | Temp 97.3°F | Resp 17 | Ht 70.0 in | Wt 272.2 lb

## 2019-08-31 DIAGNOSIS — E782 Mixed hyperlipidemia: Secondary | ICD-10-CM | POA: Diagnosis not present

## 2019-08-31 DIAGNOSIS — I10 Essential (primary) hypertension: Secondary | ICD-10-CM

## 2019-08-31 DIAGNOSIS — E039 Hypothyroidism, unspecified: Secondary | ICD-10-CM

## 2019-08-31 HISTORY — DX: Hypothyroidism, unspecified: E03.9
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
## 2019-09-01 LAB — COMPREHENSIVE METABOLIC PANEL
ALT: 25 IU/L (ref 0–44)
AST: 28 IU/L (ref 0–40)
Albumin/Globulin Ratio: 1.8 (ref 1.2–2.2)
Albumin: 4.4 g/dL (ref 3.8–4.8)
Alkaline Phosphatase: 74 IU/L (ref 48–121)
BUN/Creatinine Ratio: 12 (ref 10–24)
BUN: 11 mg/dL (ref 8–27)
Bilirubin Total: 0.6 mg/dL (ref 0.0–1.2)
CO2: 23 mmol/L (ref 20–29)
Calcium: 9.3 mg/dL (ref 8.6–10.2)
Chloride: 105 mmol/L (ref 96–106)
Creatinine, Ser: 0.9 mg/dL (ref 0.76–1.27)
GFR calc Af Amer: 103 mL/min/{1.73_m2} (ref >59)
GFR calc non Af Amer: 89 mL/min/{1.73_m2} (ref >59)
Globulin, Total: 2.5 g/dL (ref 1.5–4.5)
Glucose: 88 mg/dL (ref 65–99)
Potassium: 4.3 mmol/L (ref 3.5–5.2)
Sodium: 142 mmol/L (ref 134–144)
Total Protein: 6.9 g/dL (ref 6.0–8.5)

## 2019-09-01 LAB — CBC WITH DIFFERENTIAL/PLATELET
Basophils Absolute: 0.1 10*3/uL (ref 0.0–0.2)
Basos: 1 %
EOS (ABSOLUTE): 0.1 10*3/uL (ref 0.0–0.4)
Eos: 1 %
Hematocrit: 42.4 % (ref 37.5–51.0)
Hemoglobin: 14.7 g/dL (ref 13.0–17.7)
Immature Grans (Abs): 0 10*3/uL (ref 0.0–0.1)
Immature Granulocytes: 0 %
Lymphocytes Absolute: 2.6 10*3/uL (ref 0.7–3.1)
Lymphs: 42 %
MCH: 33.2 pg — ABNORMAL HIGH (ref 26.6–33.0)
MCHC: 34.7 g/dL (ref 31.5–35.7)
MCV: 96 fL (ref 79–97)
Monocytes Absolute: 0.6 10*3/uL (ref 0.1–0.9)
Monocytes: 9 %
Neutrophils Absolute: 2.8 10*3/uL (ref 1.4–7.0)
Neutrophils: 47 %
Platelets: 244 10*3/uL (ref 150–450)
RBC: 4.43 x10E6/uL (ref 4.14–5.80)
RDW: 12.8 % (ref 11.6–15.4)
WBC: 6.1 10*3/uL (ref 3.4–10.8)

## 2019-09-01 LAB — LIPID PANEL
Chol/HDL Ratio: 4 ratio (ref 0.0–5.0)
Cholesterol, Total: 124 mg/dL (ref 100–199)
HDL: 31 mg/dL — ABNORMAL LOW (ref >39)
LDL Chol Calc (NIH): 69 mg/dL (ref 0–99)
Triglycerides: 138 mg/dL (ref 0–149)
VLDL Cholesterol Cal: 24 mg/dL (ref 5–40)

## 2019-09-01 LAB — TSH: TSH: 7.13 u[IU]/mL — ABNORMAL HIGH (ref 0.450–4.500)

## 2019-09-01 LAB — CARDIOVASCULAR RISK ASSESSMENT

## 2019-09-03 ENCOUNTER — Other Ambulatory Visit: Payer: Self-pay | Admitting: Family Medicine

## 2019-09-03 DIAGNOSIS — E039 Hypothyroidism, unspecified: Secondary | ICD-10-CM

## 2019-09-03 MED ORDER — LEVOTHYROXINE SODIUM 50 MCG PO TABS
50.0000 ug | ORAL_TABLET | Freq: Every day | ORAL | 1 refills | Status: DC
Start: 2019-09-03 — End: 2020-02-12

## 2019-09-03 NOTE — Progress Notes (Signed)
Recheck level in 6weeks

## 2019-09-04 ENCOUNTER — Telehealth (INDEPENDENT_AMBULATORY_CARE_PROVIDER_SITE_OTHER): Payer: 59 | Admitting: Family Medicine

## 2019-09-04 ENCOUNTER — Encounter: Payer: Self-pay | Admitting: Family Medicine

## 2019-09-04 VITALS — BP 128/88 | HR 72 | Temp 96.7°F | Ht 70.0 in

## 2019-09-04 DIAGNOSIS — E782 Mixed hyperlipidemia: Secondary | ICD-10-CM | POA: Diagnosis not present

## 2019-09-04 DIAGNOSIS — Z0001 Encounter for general adult medical examination with abnormal findings: Secondary | ICD-10-CM | POA: Diagnosis not present

## 2019-09-04 DIAGNOSIS — N528 Other male erectile dysfunction: Secondary | ICD-10-CM

## 2019-09-04 DIAGNOSIS — I10 Essential (primary) hypertension: Secondary | ICD-10-CM

## 2019-09-04 MED ORDER — ROSUVASTATIN CALCIUM 20 MG PO TABS
20.0000 mg | ORAL_TABLET | Freq: Every day | ORAL | 0 refills | Status: DC
Start: 2019-09-04 — End: 2020-01-03
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
## 2019-09-04 NOTE — Patient Instructions (Addendum)
Healthy Eating Following a healthy eating pattern may help you to achieve and maintain a healthy body weight, reduce the risk of chronic disease, and live a long and productive life. It is important to follow a healthy eating pattern at an appropriate calorie level for your body. Your nutritional needs should be met primarily through food by choosing a variety of nutrient-rich foods. What are tips for following this plan? Reading food labels  Read labels and choose the following: ? Reduced or low sodium. ? Juices with 100% fruit juice. ? Foods with low saturated fats and high polyunsaturated and monounsaturated fats. ? Foods with whole grains, such as whole wheat, cracked wheat, brown rice, and wild rice. ? Whole grains that are fortified with folic acid. This is recommended for women who are pregnant or who want to become pregnant.  Read labels and avoid the following: ? Foods with a lot of added sugars. These include foods that contain brown sugar, corn sweetener, corn syrup, dextrose, fructose, glucose, high-fructose corn syrup, honey, invert sugar, lactose, malt syrup, maltose, molasses, raw sugar, sucrose, trehalose, or turbinado sugar.  Do not eat more than the following amounts of added sugar per day:  6 teaspoons (25 g) for women.  9 teaspoons (38 g) for men. ? Foods that contain processed or refined starches and grains. ? Refined grain products, such as white flour, degermed cornmeal, white bread, and white rice. Shopping  Choose nutrient-rich snacks, such as vegetables, whole fruits, and nuts. Avoid high-calorie and high-sugar snacks, such as potato chips, fruit snacks, and candy.  Use oil-based dressings and spreads on foods instead of solid fats such as butter, stick margarine, or cream cheese.  Limit pre-made sauces, mixes, and "instant" products such as flavored rice, instant noodles, and ready-made pasta.  Try more plant-protein sources, such as tofu, tempeh, black beans,  edamame, lentils, nuts, and seeds.  Explore eating plans such as the Mediterranean diet or vegetarian diet. Cooking  Use oil to saut or stir-fry foods instead of solid fats such as butter, stick margarine, or lard.  Try baking, boiling, grilling, or broiling instead of frying.  Remove the fatty part of meats before cooking.  Steam vegetables in water or broth. Meal planning   At meals, imagine dividing your plate into fourths: ? One-half of your plate is fruits and vegetables. ? One-fourth of your plate is whole grains. ? One-fourth of your plate is protein, especially lean meats, poultry, eggs, tofu, beans, or nuts.  Include low-fat dairy as part of your daily diet. Lifestyle  Choose healthy options in all settings, including home, work, school, restaurants, or stores.  Prepare your food safely: ? Wash your hands after handling raw meats. ? Keep food preparation surfaces clean by regularly washing with hot, soapy water. ? Keep raw meats separate from ready-to-eat foods, such as fruits and vegetables. ? Cook seafood, meat, poultry, and eggs to the recommended internal temperature. ? Store foods at safe temperatures. In general:  Keep cold foods at 59F (4.4C) or below.  Keep hot foods at 159F (60C) or above.  Keep your freezer at [facility] (-17.8C) or below.  Foods are no longer safe to eat when they have been between the temperatures of 40-159F (4.4-60C) for more than 2 hours. What foods should I eat? Fruits Aim to eat 2 cup-equivalents of fresh, canned (in natural juice), or frozen fruits each day. Examples of 1 cup-equivalent of fruit include 1 small apple, 8 large strawberries, 1 cup canned fruit,  cup  dried fruit, or 1 cup 100% juice. Vegetables Aim to eat 2-3 cup-equivalents of fresh and frozen vegetables each day, including different varieties and colors. Examples of 1 cup-equivalent of vegetables include 2 medium carrots, 2 cups raw, leafy greens, 1 cup chopped  vegetable (raw or cooked), or 1 medium baked potato. Grains Aim to eat 6 ounce-equivalents of whole grains each day. Examples of 1 ounce-equivalent of grains include 1 slice of bread, 1 cup ready-to-eat cereal, 3 cups popcorn, or  cup cooked rice, pasta, or cereal. Meats and other proteins Aim to eat 5-6 ounce-equivalents of protein each day. Examples of 1 ounce-equivalent of protein include 1 egg, 1/2 cup nuts or seeds, or 1 tablespoon (16 g) peanut butter. A cut of meat or fish that is the size of a deck of cards is about 3-4 ounce-equivalents.  Of the protein you eat each week, try to have at least 8 ounces come from seafood. This includes salmon, trout, herring, and anchovies. Dairy Aim to eat 3 cup-equivalents of fat-free or low-fat dairy each day. Examples of 1 cup-equivalent of dairy include 1 cup (240 mL) milk, 8 ounces (250 g) yogurt, 1 ounces (44 g) natural cheese, or 1 cup (240 mL) fortified soy milk. Fats and oils  Aim for about 5 teaspoons (21 g) per day. Choose monounsaturated fats, such as canola and olive oils, avocados, peanut butter, and most nuts, or polyunsaturated fats, such as sunflower, corn, and soybean oils, walnuts, pine nuts, sesame seeds, sunflower seeds, and flaxseed. Beverages  Aim for six 8-oz glasses of water per day. Limit coffee to three to five 8-oz cups per day.  Limit caffeinated beverages that have added calories, such as soda and energy drinks.  Limit alcohol intake to no more than 1 drink a day for nonpregnant women and 2 drinks a day for men. One drink equals 12 oz of beer (355 mL), 5 oz of wine (148 mL), or 1 oz of hard liquor (44 mL). Seasoning and other foods  Avoid adding excess amounts of salt to your foods. Try flavoring foods with herbs and spices instead of salt.  Avoid adding sugar to foods.  Try using oil-based dressings, sauces, and spreads instead of solid fats. This information is based on general [place] nutrition guidelines. For more  information, visit [employer]. Exact amounts may vary based on your nutrition needs. Summary  A healthy eating plan may help you to maintain a healthy weight, reduce the risk of chronic diseases, and stay active throughout your life.  Plan your meals. Make sure you eat the right portions of a variety of nutrient-rich foods.  Try baking, boiling, grilling, or broiling instead of frying.  Choose healthy options in all settings, including home, work, school, restaurants, or stores. This information is not intended to replace advice given to you by your health care provider. Make sure you discuss any questions you have with your health care provider. Document Revised: 04/05/2017 Document Reviewed: 04/05/2017 Elsevier Patient Education  2020 [employer]. Start on [place] injection once weekly.  Healthy Eating Following a healthy eating pattern may help you to achieve and maintain a healthy body weight, reduce the risk of chronic disease, and live a long and productive life. It is important to follow a healthy eating pattern at an appropriate calorie level for your body. Your nutritional needs should be met primarily through food by choosing a variety of nutrient-rich foods. What are tips for following this plan? Reading food labels  Read labels and choose  the following: ? Reduced or low sodium. ? Juices with 100% fruit juice. ? Foods with low saturated fats and high polyunsaturated and monounsaturated fats. ? Foods with whole grains, such as whole wheat, cracked wheat, brown rice, and wild rice. ? Whole grains that are fortified with folic acid. This is recommended for women who are pregnant or who want to become pregnant.  Read labels and avoid the following: ? Foods with a lot of added sugars. These include foods that contain brown sugar, corn sweetener, corn syrup, dextrose, fructose, glucose, high-fructose corn syrup, honey, invert sugar, lactose, malt syrup, maltose, molasses, raw  sugar, sucrose, trehalose, or turbinado sugar.  Do not eat more than the following amounts of added sugar per day:  6 teaspoons (25 g) for women.  9 teaspoons (38 g) for men. ? Foods that contain processed or refined starches and grains. ? Refined grain products, such as white flour, degermed cornmeal, white bread, and white rice. Shopping  Choose nutrient-rich snacks, such as vegetables, whole fruits, and nuts. Avoid high-calorie and high-sugar snacks, such as potato chips, fruit snacks, and candy.  Use oil-based dressings and spreads on foods instead of solid fats such as butter, stick margarine, or cream cheese.  Limit pre-made sauces, mixes, and "instant" products such as flavored rice, instant noodles, and ready-made pasta.  Try more plant-protein sources, such as tofu, tempeh, black beans, edamame, lentils, nuts, and seeds.  Explore eating plans such as the Mediterranean diet or vegetarian diet. Cooking  Use oil to saut or stir-fry foods instead of solid fats such as butter, stick margarine, or lard.  Try baking, boiling, grilling, or broiling instead of frying.  Remove the fatty part of meats before cooking.  Steam vegetables in water or broth. Meal planning   At meals, imagine dividing your plate into fourths: ? One-half of your plate is fruits and vegetables. ? One-fourth of your plate is whole grains. ? One-fourth of your plate is protein, especially lean meats, poultry, eggs, tofu, beans, or nuts.  Include low-fat dairy as part of your daily diet. Lifestyle  Choose healthy options in all settings, including home, work, school, restaurants, or stores.  Prepare your food safely: ? Wash your hands after handling raw meats. ? Keep food preparation surfaces clean by regularly washing with hot, soapy water. ? Keep raw meats separate from ready-to-eat foods, such as fruits and vegetables. ? Cook seafood, meat, poultry, and eggs to the recommended internal  temperature. ? Store foods at safe temperatures. In general:  Keep cold foods at 26F (4.4C) or below.  Keep hot foods at 126F (60C) or above.  Keep your freezer at [facility] (-17.8C) or below.  Foods are no longer safe to eat when they have been between the temperatures of 40-126F (4.4-60C) for more than 2 hours. What foods should I eat? Fruits Aim to eat 2 cup-equivalents of fresh, canned (in natural juice), or frozen fruits each day. Examples of 1 cup-equivalent of fruit include 1 small apple, 8 large strawberries, 1 cup canned fruit,  cup dried fruit, or 1 cup 100% juice. Vegetables Aim to eat 2-3 cup-equivalents of fresh and frozen vegetables each day, including different varieties and colors. Examples of 1 cup-equivalent of vegetables include 2 medium carrots, 2 cups raw, leafy greens, 1 cup chopped vegetable (raw or cooked), or 1 medium baked potato. Grains Aim to eat 6 ounce-equivalents of whole grains each day. Examples of 1 ounce-equivalent of grains include 1 slice of bread, 1 cup ready-to-eat cereal,  3 cups popcorn, or  cup cooked rice, pasta, or cereal. Meats and other proteins Aim to eat 5-6 ounce-equivalents of protein each day. Examples of 1 ounce-equivalent of protein include 1 egg, 1/2 cup nuts or seeds, or 1 tablespoon (16 g) peanut butter. A cut of meat or fish that is the size of a deck of cards is about 3-4 ounce-equivalents.  Of the protein you eat each week, try to have at least 8 ounces come from seafood. This includes salmon, trout, herring, and anchovies. Dairy Aim to eat 3 cup-equivalents of fat-free or low-fat dairy each day. Examples of 1 cup-equivalent of dairy include 1 cup (240 mL) milk, 8 ounces (250 g) yogurt, 1 ounces (44 g) natural cheese, or 1 cup (240 mL) fortified soy milk. Fats and oils  Aim for about 5 teaspoons (21 g) per day. Choose monounsaturated fats, such as canola and olive oils, avocados, peanut butter, and most nuts, or polyunsaturated  fats, such as sunflower, corn, and soybean oils, walnuts, pine nuts, sesame seeds, sunflower seeds, and flaxseed. Beverages  Aim for six 8-oz glasses of water per day. Limit coffee to three to five 8-oz cups per day.  Limit caffeinated beverages that have added calories, such as soda and energy drinks.  Limit alcohol intake to no more than 1 drink a day for nonpregnant women and 2 drinks a day for men. One drink equals 12 oz of beer (355 mL), 5 oz of wine (148 mL), or 1 oz of hard liquor (44 mL). Seasoning and other foods  Avoid adding excess amounts of salt to your foods. Try flavoring foods with herbs and spices instead of salt.  Avoid adding sugar to foods.  Try using oil-based dressings, sauces, and spreads instead of solid fats. This information is based on general [place].S[place] nutrition guidelines. For more information, visit [employer]. Exact amounts may vary based on your nutrition needs. Summary  A healthy eating plan may help you to maintain a healthy weight, reduce the risk of chronic diseases, and stay active throughout your life.  Plan your meals. Make sure you eat the right portions of a variety of nutrient-rich foods.  Try baking, boiling, grilling, or broiling instead of frying.  Choose healthy options in all settings, including home, work, school, restaurants, or stores. This information is not intended to replace advice given to you by your health care provider. Make sure you discuss any questions you have with your health care provider. Document Revised: 04/05/2017 Document Reviewed: 04/05/2017 Elsevier Patient Education  [place].

## 2019-09-08 ENCOUNTER — Other Ambulatory Visit: Payer: Self-pay

## 2019-09-08 ENCOUNTER — Ambulatory Visit: Payer: 59

## 2019-10-14 ENCOUNTER — Other Ambulatory Visit: Payer: Self-pay | Admitting: Family Medicine

## 2019-10-20 ENCOUNTER — Ambulatory Visit: Payer: 59 | Admitting: Family Medicine

## 2019-10-23 ENCOUNTER — Other Ambulatory Visit: Payer: Self-pay

## 2019-10-23 MED ORDER — OLMESARTAN MEDOXOMIL 40 MG PO TABS
40.0000 mg | ORAL_TABLET | Freq: Every day | ORAL | 0 refills | Status: DC
Start: 2019-10-23 — End: 2020-01-01

## 2019-11-01 ENCOUNTER — Other Ambulatory Visit: Payer: Self-pay | Admitting: Family Medicine

## 2019-12-11 ENCOUNTER — Telehealth (INDEPENDENT_AMBULATORY_CARE_PROVIDER_SITE_OTHER): Payer: 59 | Admitting: Nurse Practitioner

## 2019-12-11 ENCOUNTER — Encounter: Payer: Self-pay | Admitting: Nurse Practitioner

## 2019-12-11 VITALS — BP 130/70 | HR 105 | Temp 99.2°F

## 2019-12-11 DIAGNOSIS — R059 Cough, unspecified: Secondary | ICD-10-CM | POA: Diagnosis not present

## 2019-12-11 DIAGNOSIS — J069 Acute upper respiratory infection, unspecified: Secondary | ICD-10-CM | POA: Diagnosis not present

## 2019-12-11 MED ORDER — HYDROCODONE-HOMATROPINE 5-1.5 MG/5ML PO SYRP: 5.0000 mL | ORAL_SOLUTION | Freq: Three times a day (TID) | ORAL | 0 refills | Status: DC | PRN
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
## 2019-12-19 ENCOUNTER — Ambulatory Visit (INDEPENDENT_AMBULATORY_CARE_PROVIDER_SITE_OTHER): Payer: 59 | Admitting: Nurse Practitioner

## 2019-12-19 ENCOUNTER — Encounter: Payer: Self-pay | Admitting: Nurse Practitioner

## 2019-12-19 VITALS — BP 128/72 | HR 88 | Temp 97.0°F | Ht 70.0 in | Wt 280.0 lb

## 2019-12-19 DIAGNOSIS — J019 Acute sinusitis, unspecified: Secondary | ICD-10-CM | POA: Diagnosis not present

## 2019-12-19 DIAGNOSIS — J328 Other chronic sinusitis: Secondary | ICD-10-CM | POA: Diagnosis not present

## 2019-12-19 DIAGNOSIS — J302 Other seasonal allergic rhinitis: Secondary | ICD-10-CM

## 2019-12-19 DIAGNOSIS — R0982 Postnasal drip: Secondary | ICD-10-CM

## 2019-12-19 DIAGNOSIS — H6123 Impacted cerumen, bilateral: Secondary | ICD-10-CM | POA: Diagnosis not present

## 2019-12-19 DIAGNOSIS — R053 Chronic cough: Secondary | ICD-10-CM | POA: Diagnosis not present

## 2019-12-19 DIAGNOSIS — Z86711 Personal history of pulmonary embolism: Secondary | ICD-10-CM

## 2019-12-19 LAB — COMPREHENSIVE METABOLIC PANEL
ALT: 32 IU/L (ref 0–44)
AST: 34 IU/L (ref 0–40)
Albumin/Globulin Ratio: 1.3 (ref 1.2–2.2)
Albumin: 4.3 g/dL (ref 3.8–4.8)
Alkaline Phosphatase: 93 IU/L (ref 44–121)
BUN/Creatinine Ratio: 12 (ref 10–24)
BUN: 14 mg/dL (ref 8–27)
Bilirubin Total: 0.5 mg/dL (ref 0.0–1.2)
CO2: 25 mmol/L (ref 20–29)
Calcium: 9.7 mg/dL (ref 8.6–10.2)
Chloride: 104 mmol/L (ref 96–106)
Creatinine, Ser: 1.13 mg/dL (ref 0.76–1.27)
GFR calc Af Amer: 77 mL/min/{1.73_m2} (ref >59)
GFR calc non Af Amer: 67 mL/min/{1.73_m2} (ref >59)
Globulin, Total: 3.3 g/dL (ref 1.5–4.5)
Glucose: 90 mg/dL (ref 65–99)
Potassium: 5.2 mmol/L (ref 3.5–5.2)
Sodium: 146 mmol/L — ABNORMAL HIGH (ref 134–144)
Total Protein: 7.6 g/dL (ref 6.0–8.5)

## 2019-12-19 LAB — POC COVID19 BINAXNOW: SARS Coronavirus 2 Ag: NEGATIVE

## 2019-12-19 MED ORDER — FLUTICASONE PROPIONATE 50 MCG/ACT NA SUSP: 2.0000 | Freq: Every day | NASAL | 6 refills | Status: DC

## 2019-12-19 MED ORDER — AMOXICILLIN-POT CLAVULANATE 875-125 MG PO TABS: 1.0000 | ORAL_TABLET | Freq: Two times a day (BID) | ORAL | 0 refills | Status: DC

## 2019-12-19 MED ORDER — BENZONATATE 100 MG PO CAPS: 100.0000 mg | ORAL_CAPSULE | Freq: Three times a day (TID) | ORAL | 0 refills | Status: DC | PRN

## 2019-12-19 NOTE — Patient Instructions (Addendum)
Obtain chest x-ray at [facility] Take Mucinex twice daily for cough Take Zyrtec daily for allergic rhinitis Push fluids, especially water Begin Augmentin for sinusitis twice daily for 5 days Use Flonase nasal spray daily Use ear wax removal kit provided Follow-up as needed  Earwax Buildup, Adult The ears produce a substance called earwax that helps keep bacteria out of the ear and protects the skin in the ear canal. Occasionally, earwax can build up in the ear and cause discomfort or hearing loss. What increases the risk? This condition is more likely to develop in people who:  Are male.  Are elderly.  Naturally produce more earwax.  Clean their ears often with cotton swabs.  Use earplugs often.  Use in-ear headphones often.  Wear hearing aids.  Have narrow ear canals.  Have earwax that is overly thick or sticky.  Have eczema.  Are dehydrated.  Have excess hair in the ear canal. What are the signs or symptoms? Symptoms of this condition include:  Reduced or muffled hearing.  A feeling of fullness in the ear or feeling that the ear is plugged.  Fluid coming from the ear.  Ear pain.  Ear itch.  Ringing in the ear.  Coughing.  An obvious piece of earwax that can be seen inside the ear canal. How is this diagnosed? This condition may be diagnosed based on:  Your symptoms.  Your medical history.  An ear exam. During the exam, your health care provider will look into your ear with an instrument called an otoscope. You may have tests, including a hearing test. How is this treated? This condition may be treated by:  Using ear drops to soften the earwax.  Having the earwax removed by a health care provider. The health care provider may: ? Flush the ear with water. ? Use an instrument that has a loop on the end (curette). ? Use a suction device.  Surgery to remove the wax buildup. This may be done in severe cases. Follow these instructions at  home:   Take over-the-counter and prescription medicines only as told by your health care provider.  Do not put any objects, including cotton swabs, into your ear. You can clean the opening of your ear canal with a washcloth or facial tissue.  Follow instructions from your health care provider about cleaning your ears. Do not over-clean your ears.  Drink enough fluid to keep your urine clear or pale yellow. This will help to thin the earwax.  Keep all follow-up visits as told by your health care provider. If earwax builds up in your ears often or if you use hearing aids, consider seeing your health care provider for routine, preventive ear cleanings. Ask your health care provider how often you should schedule your cleanings.  If you have hearing aids, clean them according to instructions from the manufacturer and your health care provider. Contact a health care provider if:  You have ear pain.  You develop a fever.  You have blood, pus, or other fluid coming from your ear.  You have hearing loss.  You have ringing in your ears that does not go away.  Your symptoms do not improve with treatment.  You feel like the room is spinning (vertigo). Summary  Earwax can build up in the ear and cause discomfort or hearing loss.  The most common symptoms of this condition include reduced or muffled hearing and a feeling of fullness in the ear or feeling that the ear is plugged.  This condition may be diagnosed based on your symptoms, your medical history, and an ear exam.  This condition may be treated by using ear drops to soften the earwax or by having the earwax removed by a health care provider.  Do not put any objects, including cotton swabs, into your ear. You can clean the opening of your ear canal with a washcloth or facial tissue. This information is not intended to replace advice given to you by your health care provider. Make sure you discuss any questions you have with your  health care provider. Document Revised: 12/04/2016 Document Reviewed: 03/04/2016 Elsevier Patient Education  [place].  Cough, Adult A cough helps to clear your throat and lungs. A cough may be a sign of an illness or another medical condition. An acute cough may only last 2-3 weeks, while a chronic cough may last 8 or more weeks. Many things can cause a cough. They include:  Germs (viruses or bacteria) that attack the airway.  Breathing in things that bother (irritate) your lungs.  Allergies.  Asthma.  Mucus that runs down the back of your throat (postnasal drip).  Smoking.  Acid backing up from the stomach into the tube that moves food from the mouth to the stomach (gastroesophageal reflux).  Some medicines.  Lung problems.  Other medical conditions, such as heart failure or a blood clot in the lung (pulmonary embolism). Follow these instructions at home: Medicines  Take over-the-counter and prescription medicines only as told by your doctor.  Talk with your doctor before you take medicines that stop a cough (coughsuppressants). Lifestyle   Do not smoke, and try not to be around smoke. Do not use any products that contain nicotine or tobacco, such as cigarettes, e-cigarettes, and chewing tobacco. If you need help quitting, ask your doctor.  Drink enough fluid to keep your pee (urine) pale yellow.  Avoid caffeine.  Do not drink alcohol if your doctor tells you not to drink. General instructions   Watch for any changes in your cough. Tell your doctor about them.  Always cover your mouth when you cough.  Stay away from things that make you cough, such as perfume, candles, campfire smoke, or cleaning products.  If the air is dry, use a cool mist vaporizer or humidifier in your home.  If your cough is worse at night, try using extra pillows to raise your head up higher while you sleep.  Rest as needed.  Keep all follow-up visits as told by your  doctor. This is important. Contact a doctor if:  You have new symptoms.  You cough up pus.  Your cough does not get better after 2-3 weeks, or your cough gets worse.  Cough medicine does not help your cough and you are not sleeping well.  You have pain that gets worse or pain that is not helped with medicine.  You have a fever.  You are losing weight and you do not know why.  You have night sweats. Get help right away if:  You cough up blood.  You have trouble breathing.  Your heartbeat is very fast. These symptoms may be an emergency. Do not wait to see if the symptoms will go away. Get medical help right away. Call your local emergency services (911 in the [place] Do not drive yourself to the hospital. Summary  A cough helps to clear your throat and lungs. Many things can cause a cough.  Take over-the-counter and prescription medicines only as told  by your doctor.  Always cover your mouth when you cough.  Contact a doctor if you have new symptoms or you have a cough that does not get better or gets worse. This information is not intended to replace advice given to you by your health care provider. Make sure you discuss any questions you have with your health care provider. Document Revised: 01/10/2018 Document Reviewed: 01/10/2018 Elsevier Patient Education  [place].  Sinusitis, Adult Sinusitis is soreness and swelling (inflammation) of your sinuses. Sinuses are hollow spaces in the bones around your face. They are located:  Around your eyes.  In the middle of your forehead.  Behind your nose.  In your cheekbones. Your sinuses and nasal passages are lined with a fluid called mucus. Mucus drains out of your sinuses. Swelling can trap mucus in your sinuses. This lets germs (bacteria, virus, or fungus) grow, which leads to infection. Most of the time, this condition is caused by a virus. What are the causes? This condition is caused  by:  Allergies.  Asthma.  Germs.  Things that block your nose or sinuses.  Growths in the nose (nasal polyps).  Chemicals or irritants in the air.  Fungus (rare). What increases the risk? You are more likely to develop this condition if:  You have a weak body defense system (immune system).  You do a lot of swimming or diving.  You use nasal sprays too much.  You smoke. What are the signs or symptoms? The main symptoms of this condition are pain and a feeling of pressure around the sinuses. Other symptoms include:  Stuffy nose (congestion).  Runny nose (drainage).  Swelling and warmth in the sinuses.  Headache.  Toothache.  A cough that may get worse at night.  Mucus that collects in the throat or the back of the nose (postnasal drip).  Being unable to smell and taste.  Being very tired (fatigue).  A fever.  Sore throat.  Bad breath. How is this diagnosed? This condition is diagnosed based on:  Your symptoms.  Your medical history.  A physical exam.  Tests to find out if your condition is short-term (acute) or long-term (chronic). Your doctor may: ? Check your nose for growths (polyps). ? Check your sinuses using a tool that has a light (endoscope). ? Check for allergies or germs. ? Do imaging tests, such as an MRI or CT scan. How is this treated? Treatment for this condition depends on the cause and whether it is short-term or long-term.  If caused by a virus, your symptoms should go away on their own within 10 days. You may be given medicines to relieve symptoms. They include: ? Medicines that shrink swollen tissue in the nose. ? Medicines that treat allergies (antihistamines). ? A spray that treats swelling of the nostrils. ? Rinses that help get rid of thick mucus in your nose (nasal saline washes).  If caused by bacteria, your doctor may wait to see if you will get better without treatment. You may be given antibiotic medicine if you  have: ? A very bad infection. ? A weak body defense system.  If caused by growths in the nose, you may need to have surgery. Follow these instructions at home: Medicines  Take, use, or apply over-the-counter and prescription medicines only as told by your doctor. These may include nasal sprays.  If you were prescribed an antibiotic medicine, take it as told by your doctor. Do not stop taking the antibiotic even if  you start to feel better. Hydrate and humidify   Drink enough water to keep your pee (urine) pale yellow.  Use a cool mist humidifier to keep the humidity level in your home above 50%.  Breathe in steam for 10-15 minutes, 3-4 times a day, or as told by your doctor. You can do this in the bathroom while a hot shower is running.  Try not to spend time in cool or dry air. Rest  Rest as much as you can.  Sleep with your head raised (elevated).  Make sure you get enough sleep each night. General instructions   Put a warm, moist washcloth on your face 3-4 times a day, or as often as told by your doctor. This will help with discomfort.  Wash your hands often with soap and water. If there is no soap and water, use hand sanitizer.  Do not smoke. Avoid being around people who are smoking (secondhand smoke).  Keep all follow-up visits as told by your doctor. This is important. Contact a doctor if:  You have a fever.  Your symptoms get worse.  Your symptoms do not get better within 10 days. Get help right away if:  You have a very bad headache.  You cannot stop throwing up (vomiting).  You have very bad pain or swelling around your face or eyes.  You have trouble seeing.  You feel confused.  Your neck is stiff.  You have trouble breathing. Summary  Sinusitis is swelling of your sinuses. Sinuses are hollow spaces in the bones around your face.  This condition is caused by tissues in your nose that become inflamed or swollen. This traps germs. These can  lead to infection.  If you were prescribed an antibiotic medicine, take it as told by your doctor. Do not stop taking it even if you start to feel better.  Keep all follow-up visits as told by your doctor. This is important. This information is not intended to replace advice given to you by your health care provider. Make sure you discuss any questions you have with your health care provider. Document Revised: 05/24/2017 Document Reviewed: 05/24/2017 Elsevier Patient Education  [place].
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
## 2019-12-28 ENCOUNTER — Other Ambulatory Visit: Payer: Self-pay | Admitting: Family Medicine

## 2020-01-01 ENCOUNTER — Other Ambulatory Visit: Payer: Self-pay

## 2020-01-01 MED ORDER — OLMESARTAN MEDOXOMIL 40 MG PO TABS
40.0000 mg | ORAL_TABLET | Freq: Every day | ORAL | 0 refills | Status: DC
Start: 2020-01-01 — End: 2020-04-24

## 2020-01-03 ENCOUNTER — Other Ambulatory Visit: Payer: Self-pay | Admitting: Family Medicine

## 2020-01-19 ENCOUNTER — Other Ambulatory Visit: Payer: Self-pay | Admitting: Nurse Practitioner

## 2020-01-19 DIAGNOSIS — J328 Other chronic sinusitis: Secondary | ICD-10-CM

## 2020-02-05 ENCOUNTER — Other Ambulatory Visit: Payer: Self-pay

## 2020-02-06 ENCOUNTER — Other Ambulatory Visit: Payer: Self-pay

## 2020-02-09 ENCOUNTER — Ambulatory Visit: Payer: 59 | Admitting: Nurse Practitioner

## 2020-02-09 ENCOUNTER — Other Ambulatory Visit: Payer: Self-pay

## 2020-02-09 VITALS — BP 118/82 | HR 87 | Temp 97.6°F | Ht 70.0 in | Wt 288.8 lb

## 2020-02-09 DIAGNOSIS — E782 Mixed hyperlipidemia: Secondary | ICD-10-CM | POA: Diagnosis not present

## 2020-02-09 DIAGNOSIS — N401 Enlarged prostate with lower urinary tract symptoms: Secondary | ICD-10-CM

## 2020-02-09 DIAGNOSIS — E039 Hypothyroidism, unspecified: Secondary | ICD-10-CM

## 2020-02-09 DIAGNOSIS — I10 Essential (primary) hypertension: Secondary | ICD-10-CM | POA: Diagnosis not present

## 2020-02-09 DIAGNOSIS — Z7901 Long term (current) use of anticoagulants: Secondary | ICD-10-CM
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
## 2020-02-09 NOTE — Patient Instructions (Signed)
Continue prescribed medications We will call you lab results Notify office if gout worsens Return in 6-weeks for thyroid labs Return in 30-months for fasting follow-up   Health Maintenance, Male Adopting a healthy lifestyle and getting preventive care are important in promoting health and wellness. Ask your health care provider about:  The right schedule for you to have regular tests and exams.  Things you can do on your own to prevent diseases and keep yourself healthy. What should I know about diet, weight, and exercise? Eat a healthy diet  Eat a diet that includes plenty of vegetables, fruits, low-fat dairy products, and lean protein.  Do not eat a lot of foods that are high in solid fats, added sugars, or sodium.   Maintain a healthy weight Body mass index (BMI) is a measurement that can be used to identify possible weight problems. It estimates body fat based on height and weight. Your health care provider can help determine your BMI and help you achieve or maintain a healthy weight. Get regular exercise Get regular exercise. This is one of the most important things you can do for your health. Most adults should:  Exercise for at least 150 minutes each week. The exercise should increase your heart rate and make you sweat (moderate-intensity exercise).  Do strengthening exercises at least twice a week. This is in addition to the moderate-intensity exercise.  Spend less time sitting. Even light physical activity can be beneficial. Watch cholesterol and blood lipids Have your blood tested for lipids and cholesterol at 68 years of age, then have this test every 5 years. You may need to have your cholesterol levels checked more often if:  Your lipid or cholesterol levels are high.  You are older than 68 years of age.  You are at high risk for heart disease. What should I know about cancer screening? Many types of cancers can be detected early and may often be prevented.  Depending on your health history and family history, you may need to have cancer screening at various ages. This may include screening for:  Colorectal cancer.  Prostate cancer.  Skin cancer.  Lung cancer. What should I know about heart disease, diabetes, and high blood pressure? Blood pressure and heart disease  High blood pressure causes heart disease and increases the risk of stroke. This is more likely to develop in people who have high blood pressure readings, are of African descent, or are overweight.  Talk with your health care provider about your target blood pressure readings.  Have your blood pressure checked: ? Every 3-5 years if you are 71-11 years of age. ? Every year if you are 8 years old or older.  If you are between the ages of 54 and 73 and are a current or former smoker, ask your health care provider if you should have a one-time screening for abdominal aortic aneurysm (AAA). Diabetes Have regular diabetes screenings. This checks your fasting blood sugar level. Have the screening done:  Once every three years after age 65 if you are at a normal weight and have a low risk for diabetes.  More often and at a younger age if you are overweight or have a high risk for diabetes. What should I know about preventing infection? Hepatitis B If you have a higher risk for hepatitis B, you should be screened for this virus. Talk with your health care provider to find out if you are at risk for hepatitis B infection. Hepatitis C Blood testing is  recommended for:  Everyone born from [birth year] through 1963.  Anyone with known risk factors for hepatitis C. Sexually transmitted infections (STIs)  You should be screened each year for STIs, including gonorrhea and chlamydia, if: ? You are sexually active and are younger than 68 years of age. ? You are older than 68 years of age and your health care provider tells you that you are at risk for this type of infection. ? Your sexual  activity has changed since you were last screened, and you are at increased risk for chlamydia or gonorrhea. Ask your health care provider if you are at risk.  Ask your health care provider about whether you are at high risk for HIV. Your health care provider may recommend a prescription medicine to help prevent HIV infection. If you choose to take medicine to prevent HIV, you should first get tested for HIV. You should then be tested every 3 months for as long as you are taking the medicine. Follow these instructions at home: Lifestyle  Do not use any products that contain nicotine or tobacco, such as cigarettes, e-cigarettes, and chewing tobacco. If you need help quitting, ask your health care provider.  Do not use street drugs.  Do not share needles.  Ask your health care provider for help if you need support or information about quitting drugs. Alcohol use  Do not drink alcohol if your health care provider tells you not to drink.  If you drink alcohol: ? Limit how much you have to 0-2 drinks a day. ? Be aware of how much alcohol is in your drink. In the [place] one drink equals one 12 oz bottle of beer (355 mL), one 5 oz glass of wine (148 mL), or one 1 oz glass of hard liquor (44 mL). General instructions  Schedule regular health, dental, and eye exams.  Stay current with your vaccines.  Tell your health care provider if: ? You often feel depressed. ? You have ever been abused or do not feel safe at home. Summary  Adopting a healthy lifestyle and getting preventive care are important in promoting health and wellness.  Follow your health care provider's instructions about healthy diet, exercising, and getting tested or screened for diseases.  Follow your health care provider's instructions on monitoring your cholesterol and blood pressure. This information is not intended to replace advice given to you by your health care provider. Make sure you discuss any questions you have  with your health care provider. Document Revised: 12/15/2017 Document Reviewed: 12/15/2017 Elsevier Patient Education  2021 [place].  Gout  Gout is painful swelling of your joints. Gout is a type of arthritis. It is caused by having too much uric acid in your body. Uric acid is a chemical that is made when your body breaks down substances called purines. If your body has too much uric acid, sharp crystals can form and build up in your joints. This causes pain and swelling. Gout attacks can happen quickly and be very painful (acute gout). Over time, the attacks can affect more joints and happen more often (chronic gout). What are the causes?  Too much uric acid in your blood. This can happen because: ? Your kidneys do not remove enough uric acid from your blood. ? Your body makes too much uric acid. ? You eat too many foods that are high in purines. These foods include organ meats, some seafood, and beer.  Trauma or stress. What increases the risk?  Having a family  history of gout.  Being male and middle-aged.  Being male and having gone through menopause.  Being very overweight (obese).  Drinking alcohol, especially beer.  Not having enough water in the body (being dehydrated).  Losing weight too quickly.  Having an organ transplant.  Having lead poisoning.  Taking certain medicines.  Having kidney disease.  Having a skin condition called psoriasis. What are the signs or symptoms? An attack of acute gout usually happens in just one joint. The most common place is the big toe. Attacks often start at night. Other joints that may be affected include joints of the feet, ankle, knee, fingers, wrist, or elbow. Symptoms of an attack may include:  Very bad pain.  Warmth.  Swelling.  Stiffness.  Shiny, red, or purple skin.  Tenderness. The affected joint may be very painful to touch.  Chills and fever. Chronic gout may cause symptoms more often. More joints may  be involved. You may also have white or yellow lumps (tophi) on your hands or feet or in other areas near your joints.   How is this treated?  Treatment for this condition has two phases: treating an acute attack and preventing future attacks.  Acute gout treatment may include: ? NSAIDs. ? Steroids. These are taken by mouth or injected into a joint. ? Colchicine. This medicine relieves pain and swelling. It can be given by mouth or through an IV tube.  Preventive treatment may include: ? Taking small doses of NSAIDs or colchicine daily. ? Using a medicine that reduces uric acid levels in your blood. ? Making changes to your diet. You may need to see a food expert (dietitian) about what to eat and drink to prevent gout. Follow these instructions at home: During a gout attack  If told, put ice on the painful area: ? Put ice in a plastic bag. ? Place a towel between your skin and the bag. ? Leave the ice on for 20 minutes, 2-3 times a day.  Raise (elevate) the painful joint above the level of your heart as often as you can.  Rest the joint as much as possible. If the joint is in your leg, you may be given crutches.  Follow instructions from your doctor about what you cannot eat or drink.   Avoiding future gout attacks  Eat a low-purine diet. Avoid foods and drinks such as: ? Liver. ? Kidney. ? Anchovies. ? Asparagus. ? Herring. ? Mushrooms. ? Mussels. ? Beer.  Stay at a healthy weight. If you want to lose weight, talk with your doctor. Do not lose weight too fast.  Start or continue an exercise plan as told by your doctor. Eating and drinking  Drink enough fluids to keep your pee (urine) pale yellow.  If you drink alcohol: ? Limit how much you use to:  0-1 drink a day for women.  0-2 drinks a day for men. ? Be aware of how much alcohol is in your drink. In the [place] one drink equals one 12 oz bottle of beer (355 mL), one 5 oz glass of wine (148 mL), or one 1 oz glass  of hard liquor (44 mL). General instructions  Take over-the-counter and prescription medicines only as told by your doctor.  Do not drive or use heavy machinery while taking prescription pain medicine.  Return to your normal activities as told by your doctor. Ask your doctor what activities are safe for you.  Keep all follow-up visits as told by your doctor. This  is important. Contact a doctor if:  You have another gout attack.  You still have symptoms of a gout attack after 10 days of treatment.  You have problems (side effects) because of your medicines.  You have chills or a fever.  You have burning pain when you pee (urinate).  You have pain in your lower back or belly. Get help right away if:  You have very bad pain.  Your pain cannot be controlled.  You cannot pee. Summary  Gout is painful swelling of the joints.  The most common site of pain is the big toe, but it can affect other joints.  Medicines and avoiding some foods can help to prevent and treat gout attacks. This information is not intended to replace advice given to you by your health care provider. Make sure you discuss any questions you have with your health care provider. Document Revised: 07/14/2017 Document Reviewed: 07/14/2017 Elsevier Patient Education  2021 [place].   Hypothyroidism  Hypothyroidism is when the thyroid gland does not make enough of certain hormones (it is underactive). The thyroid gland is a small gland located in the lower front part of the neck, just in front of the windpipe (trachea). This gland makes hormones that help control how the body uses food for energy (metabolism) as well as how the heart and brain function. These hormones also play a role in keeping your bones strong. When the thyroid is underactive, it produces too little of the hormones thyroxine (T4) and triiodothyronine (T3). What are the causes? This condition may be caused by:  Hashimoto's disease. This  is a disease in which the body's disease-fighting system (immune system) attacks the thyroid gland. This is the most common cause.  Viral infections.  Pregnancy.  Certain medicines.  Birth defects.  Past radiation treatments to the head or neck for cancer.  Past treatment with radioactive iodine.  Past exposure to radiation in the environment.  Past surgical removal of part or all of the thyroid.  Problems with a gland in the center of the brain (pituitary gland).  Lack of enough iodine in the diet. What increases the risk? You are more likely to develop this condition if:  You are male.  You have a family history of thyroid conditions.  You use a medicine called lithium.  You take medicines that affect the immune system (immunosuppressants). What are the signs or symptoms? Symptoms of this condition include:  Feeling as though you have no energy (lethargy).  Not being able to tolerate cold.  Weight gain that is not explained by a change in diet or exercise habits.  Lack of appetite.  Dry skin.  Coarse hair.  Menstrual irregularity.  Slowing of thought processes.  Constipation.  Sadness or depression. How is this diagnosed? This condition may be diagnosed based on:  Your symptoms, your medical history, and a physical exam.  Blood tests. You may also have imaging tests, such as an ultrasound or MRI. How is this treated? This condition is treated with medicine that replaces the thyroid hormones that your body does not make. After you begin treatment, it may take several weeks for symptoms to go away. Follow these instructions at home:  Take over-the-counter and prescription medicines only as told by your health care provider.  If you start taking any new medicines, tell your health care provider.  Keep all follow-up visits as told by your health care provider. This is important. ? As your condition improves, your dosage of thyroid  hormone medicine  may change. ? You will need to have blood tests regularly so that your health care provider can monitor your condition. Contact a health care provider if:  Your symptoms do not get better with treatment.  You are taking thyroid hormone replacement medicine and you: ? Sweat a lot. ? Have tremors. ? Feel anxious. ? Lose weight rapidly. ? Cannot tolerate heat. ? Have emotional swings. ? Have diarrhea. ? Feel weak. Get help right away if you have:  Chest pain.  An irregular heartbeat.  A rapid heartbeat.  Difficulty breathing. Summary  Hypothyroidism is when the thyroid gland does not make enough of certain hormones (it is underactive).  When the thyroid is underactive, it produces too little of the hormones thyroxine (T4) and triiodothyronine (T3).  The most common cause is Hashimoto's disease, a disease in which the body's disease-fighting system (immune system) attacks the thyroid gland. The condition can also be caused by viral infections, medicine, pregnancy, or past radiation treatment to the head or neck.  Symptoms may include weight gain, dry skin, constipation, feeling as though you do not have energy, and not being able to tolerate cold.  This condition is treated with medicine to replace the thyroid hormones that your body does not make. This information is not intended to replace advice given to you by your health care provider. Make sure you discuss any questions you have with your health care provider. Document Revised: 09/22/2019 Document Reviewed: 09/07/2019 Elsevier Patient Education  2021 [place].  Levothyroxine oral capsules What is this medicine? LEVOTHYROXINE (lee voe thye ROX een) is a thyroid hormone. This medicine can improve symptoms of thyroid deficiency such as slow speech, lack of energy, weight gain, hair loss, dry skin, and feeling cold. It also helps to treat goiter (an enlarged thyroid gland). It is also used to treat some kinds of thyroid  cancer along with surgery and other medicines. This medicine may be used for other purposes; ask your health care provider or pharmacist if you have questions. COMMON BRAND NAME(S): Tirosint What should I tell my health care provider before I take this medicine? They need to know if you have any of these conditions:  Addison's disease or other adrenal gland problem  angina  bone problems  diabetes  dieting or on a weight loss program  fertility problems  heart disease  pituitary gland problem  take medicines that treat or prevent blood clots  an unusual or allergic reaction to levothyroxine, thyroid hormones, glycerin, glycerol, other medicines, foods, dyes, or preservatives  pregnant or trying to get pregnant  breast-feeding How should I use this medicine? Take this medicine by mouth with a glass of water. It is best to take on an empty stomach, at least 30 minutes to one hour before breakfast. Avoid taking antacids containing aluminum or magnesium, simethicone, bile acid sequestrants, calcium carbonate, sodium polystyrene sulfonate, ferrous sulfate, sevelamer, lanthanum, or sucralfate within 4 hours of taking this medicine. Do not cut, crush or chew this medicine. Follow the directions on the prescription label. Take at the same time each day. Do not take your medicine more often than directed. Talk to your pediatrician regarding the use of this medicine in children. While this drug may be prescribed for selected conditions, precautions do apply. Since the capsules cannot be crushed or placed in water, they may only be given to infants and children who are able to swallow an intact capsule. Overdosage: If you think you have taken too much  of this medicine contact a poison control center or emergency room at once. NOTE: This medicine is only for you. Do not share this medicine with others. What if I miss a dose? If you miss a dose, take it as soon as you can. If it is almost time  for your next dose, take only that dose. Do not take double or extra doses. What may interact with this medicine?  amiodarone  antacids  anti-thyroid medicines  calcium supplements  carbamazepine  certain medicines for depression  certain medicines to treat cancer  cholestyramine  clofibrate  colesevelam  colestipol  digoxin  male hormones, like estrogens and birth control pills, patches, rings, or injections  iron supplements  glyoxylate  ketamine  lanthanum  liquid nutrition products like Ensure  lithium  medicines for colds and breathing difficulties  medicines for diabetes  medicines or dietary supplements for weight loss  methadone  niacin  orlistat  oxandrolone  phenobarbital or other barbiturates  phenytoin  rifampin  sevelamer  simethicone  sodium polystyrene sulfonate  soy isoflavones  steroid medicines like prednisone or cortisone  sucralfate  testosterone  theophylline  warfarin This list may not describe all possible interactions. Give your health care provider a list of all the medicines, herbs, non-prescription drugs, or dietary supplements you use. Also tell them if you smoke, drink alcohol, or use illegal drugs. Some items may interact with your medicine. What should I watch for while using this medicine? Do not switch brands of this medicine unless your health care professional agrees with the change. Ask questions if you are uncertain. You will need regular exams and occasional blood tests to check the response to treatment. If you are receiving this medicine for an underactive thyroid, it may be several weeks before you notice an improvement. Check with your doctor or health care professional if your symptoms do not improve. It may be necessary for you to take this medicine for the rest of your life. Do not stop using this medicine unless your doctor or health care professional advises you to. This medicine can  affect blood sugar levels. If you have diabetes, check your blood sugar as directed. You may lose some of your hair when you first start treatment. With time, this usually corrects itself. If you are going to have surgery, tell your doctor or health care professional that you are taking this medicine. What side effects may I notice from receiving this medicine? Side effects that you should report to your doctor or health care professional as soon as possible:  allergic reactions like skin rash, itching or hives, swelling of the face, lips, or tongue  anxious  breathing problems  changes in menstrual periods  chest pain  diarrhea  excessive sweating or intolerance to heat  fast or irregular heartbeat  leg cramps  nervousness  swelling of ankles, feet, or legs  tremors  trouble sleeping  vomiting Side effects that usually do not require medical attention (report to your doctor or health care professional if they continue or are bothersome):  changes in appetite  headache  irritable  nausea  weight loss This list may not describe all possible side effects. Call your doctor for medical advice about side effects. You may report side effects to FDA at 1-800-FDA-1088. Where should I keep my medicine? Keep out of the reach of children. Store at room temperature between 15 and 30 degrees C (59 and 86 degrees F). Protect from light and moisture. Keep container tightly closed.  Throw away any unused medicine after the expiration date. NOTE: This sheet is a summary. It may not cover all possible information. If you have questions about this medicine, talk to your doctor, pharmacist, or health care provider.  2021 Elsevier/Gold Standard (2018-12-15 13:23:17)  Prostate Cancer Screening  Prostate cancer screening is a test that is done to check for the presence of prostate cancer in men. The prostate gland is a walnut-sized gland that is located below the bladder and in front of  the rectum in males. The function of the prostate is to add fluid to semen during ejaculation. Prostate cancer is the second most common type of cancer in men. Who should have prostate cancer screening?  Screening recommendations vary based on age and other risk factors. Screening is recommended if:  You are older than age 3. If you are age 50-69, talk with your health care provider about your need for screening and how often screening should be done. Because most prostate cancers are slow growing and will not cause death, screening is generally reserved in this age group for men who have a 10-15-year life expectancy.  You are younger than age 32, and you have these risk factors: ? Being a black male or a male of African descent. ? Having a father, brother, or uncle who has been diagnosed with prostate cancer. The risk is higher if your family member's cancer occurred at an early age. Screening is not recommended if:  You are younger than age 110.  You are between the ages of 85 and 53 and you have no risk factors.  You are 14 years of age or older. At this age, the risks that screening can cause are greater than the benefits that it may provide. If you are at high risk for prostate cancer, your health care provider may recommend that you have screenings more often or that you start screening at a younger age. How is screening for prostate cancer done? The recommended prostate cancer screening test is a blood test called the prostate-specific antigen (PSA) test. PSA is a protein that is made in the prostate. As you age, your prostate naturally produces more PSA. Abnormally high PSA levels may be caused by:  Prostate cancer.  An enlarged prostate that is not caused by cancer (benign prostatic hyperplasia, BPH). This condition is very common in older men.  A prostate gland infection (prostatitis). Depending on the PSA results, you may need more tests, such as:  A physical exam to check the  size of your prostate gland.  Blood and imaging tests.  A procedure to remove tissue samples from your prostate gland for testing (biopsy). What are the benefits of prostate cancer screening?  Screening can help to identify cancer at an early stage, before symptoms start and when the cancer can be treated more easily.  There is a small chance that screening may lower your risk of dying from prostate cancer. The chance is small because prostate cancer is a slow-growing cancer, and most men with prostate cancer die from a different cause. What are the risks of prostate cancer screening? The main risk of prostate cancer screening is diagnosing and treating prostate cancer that would never have caused any symptoms or problems. This is called overdiagnosisand overtreatment. PSA screening cannot tell you if your PSA is high due to cancer or a different cause. A prostate biopsy is the only procedure to diagnose prostate cancer. Even the results of a biopsy may not tell you if  your cancer needs to be treated. Slow-growing prostate cancer may not need any treatment other than monitoring, so diagnosing and treating it may cause unnecessary stress or other side effects. A prostate biopsy may also cause:  Infection or fever.  A false negative. This is a result that shows that you do not have prostate cancer when you actually do have prostate cancer. Questions to ask your health care provider  When should I start prostate cancer screening?  What is my risk for prostate cancer?  How often do I need screening?  What type of screening tests do I need?  How do I get my test results?  What do my results mean?  Do I need treatment? Where to find more information  The American Cancer Society: www.cancer.org  American Urological Association: www.auanet.org Contact a health care provider if:  You have difficulty urinating.  You have pain when you urinate or ejaculate.  You have blood in your  urine or semen.  You have pain in your back or in the area of your prostate. Summary  Prostate cancer is a common type of cancer in men. The prostate gland is located below the bladder and in front of the rectum. This gland adds fluid to semen during ejaculation.  Prostate cancer screening may identify cancer at an early stage, when the cancer can be treated more easily.  The prostate-specific antigen (PSA) test is the recommended screening test for prostate cancer.  Discuss the risks and benefits of prostate cancer screening with your health care provider. If you are age 15 or older, the risks that screening can cause are greater than the benefits that it may provide. This information is not intended to replace advice given to you by your health care provider. Make sure you discuss any questions you have with your health care provider. Document Revised: 04/14/2019 Document Reviewed: 08/04/2018 Elsevier Patient Education  [place]. Benign Prostatic Hyperplasia  Benign prostatic hyperplasia (BPH) is an enlarged prostate gland that is caused by the normal aging process and not by cancer. The prostate is a walnut-sized gland that is involved in the production of semen. It is located in front of the rectum and below the bladder. The bladder stores urine and the urethra is the tube that carries the urine out of the body. The prostate may get bigger as a man gets older. An enlarged prostate can press on the urethra. This can make it harder to pass urine. The build-up of urine in the bladder can cause infection. Back pressure and infection may progress to bladder damage and kidney (renal) failure. What are the causes? This condition is part of a normal aging process. However, not all men develop problems from this condition. If the prostate enlarges away from the urethra, urine flow will not be blocked. If it enlarges toward the urethra and compresses it, there will be problems passing  urine. What increases the risk? This condition is more likely to develop in men over the age of 35 years. What are the signs or symptoms? Symptoms of this condition include:  Getting up often during the night to urinate.  Needing to urinate frequently during the day.  Difficulty starting urine flow.  Decrease in size and strength of your urine stream.  Leaking (dribbling) after urinating.  Inability to pass urine. This needs immediate treatment.  Inability to completely empty your bladder.  Pain when you pass urine. This is more common if there is also an infection.  Urinary tract  infection (UTI). How is this diagnosed? This condition is diagnosed based on your medical history, a physical exam, and your symptoms. Tests will also be done, such as:  A post-void bladder scan. This measures any amount of urine that may remain in your bladder after you finish urinating.  A digital rectal exam. In a rectal exam, your health care provider checks your prostate by putting a lubricated, gloved finger into your rectum to feel the back of your prostate gland. This exam detects the size of your gland and any abnormal lumps or growths.  An exam of your urine (urinalysis).  A prostate specific antigen (PSA) screening. This is a blood test used to screen for prostate cancer.  An ultrasound. This test uses sound waves to electronically produce a picture of your prostate gland. Your health care provider may refer you to a specialist in kidney and prostate diseases (urologist). How is this treated? Once symptoms begin, your health care provider will monitor your condition (active surveillance or watchful waiting). Treatment for this condition will depend on the severity of your condition. Treatment may include:  Observation and yearly exams. This may be the only treatment needed if your condition and symptoms are mild.  Medicines to relieve your symptoms, including: ? Medicines to shrink the  prostate. ? Medicines to relax the muscle of the prostate.  Surgery in severe cases. Surgery may include: ? Prostatectomy. In this procedure, the prostate tissue is removed completely through an open incision or with a laparoscope or robotics. ? Transurethral resection of the prostate (TURP). In this procedure, a tool is inserted through the opening at the tip of the penis (urethra). It is used to cut away tissue of the inner core of the prostate. The pieces are removed through the same opening of the penis. This removes the blockage. ? Transurethral incision (TUIP). In this procedure, small cuts are made in the prostate. This lessens the prostate's pressure on the urethra. ? Transurethral microwave thermotherapy (TUMT). This procedure uses microwaves to create heat. The heat destroys and removes a small amount of prostate tissue. ? Transurethral needle ablation (TUNA). This procedure uses radio frequencies to destroy and remove a small amount of prostate tissue. ? Interstitial laser coagulation ([place]). This procedure uses a laser to destroy and remove a small amount of prostate tissue. ? Transurethral electrovaporization (TUVP). This procedure uses electrodes to destroy and remove a small amount of prostate tissue. ? Prostatic urethral lift. This procedure inserts an implant to push the lobes of the prostate away from the urethra. Follow these instructions at home:  Take over-the-counter and prescription medicines only as told by your health care provider.  Monitor your symptoms for any changes. Contact your health care provider with any changes.  Avoid drinking large amounts of liquid before going to bed or out in public.  Avoid or reduce how much caffeine or alcohol you drink.  Give yourself time when you urinate.  Keep all follow-up visits as told by your health care provider. This is important. Contact a health care provider if:  You have unexplained back pain.  Your symptoms do not  get better with treatment.  You develop side effects from the medicine you are taking.  Your urine becomes very dark or has a bad smell.  Your lower abdomen becomes distended and you have trouble passing your urine. Get help right away if:  You have a fever or chills.  You suddenly cannot urinate.  You feel lightheaded, or very dizzy, or  you faint.  There are large amounts of blood or clots in the urine.  Your urinary problems become hard to manage.  You develop moderate to severe low back or flank pain. The flank is the side of your body between the ribs and the hip. These symptoms may represent a serious problem that is an emergency. Do not wait to see if the symptoms will go away. Get medical help right away. Call your local emergency services (911 in the [place] Do not drive yourself to the hospital. Summary  Benign prostatic hyperplasia (BPH) is an enlarged prostate that is caused by the normal aging process and not by cancer.  An enlarged prostate can press on the urethra. This can make it hard to pass urine.  This condition is part of a normal aging process and is more likely to develop in men over the age of 58 years.  Get help right away if you suddenly cannot urinate. This information is not intended to replace advice given to you by your health care provider. Make sure you discuss any questions you have with your health care provider. Document Revised: 08/31/2019 Document Reviewed: 08/31/2019 Elsevier Patient Education  [place].

## 2020-02-10 LAB — LIPID PANEL
Chol/HDL Ratio: 3.5 ratio (ref 0.0–5.0)
Cholesterol, Total: 118 mg/dL (ref 100–199)
HDL: 34 mg/dL — ABNORMAL LOW (ref >39)
LDL Chol Calc (NIH): 65 mg/dL (ref 0–99)
Triglycerides: 103 mg/dL (ref 0–149)
VLDL Cholesterol Cal: 19 mg/dL (ref 5–40)

## 2020-02-10 LAB — COMPREHENSIVE METABOLIC PANEL
ALT: 26 IU/L (ref 0–44)
AST: 26 IU/L (ref 0–40)
Albumin/Globulin Ratio: 1.9 (ref 1.2–2.2)
Albumin: 4.1 g/dL (ref 3.8–4.8)
Alkaline Phosphatase: 63 IU/L (ref 44–121)
BUN/Creatinine Ratio: 13 (ref 10–24)
BUN: 13 mg/dL (ref 8–27)
Bilirubin Total: 0.3 mg/dL (ref 0.0–1.2)
CO2: 24 mmol/L (ref 20–29)
Calcium: 8.9 mg/dL (ref 8.6–10.2)
Chloride: 105 mmol/L (ref 96–106)
Creatinine, Ser: 1.04 mg/dL (ref 0.76–1.27)
GFR calc Af Amer: 85 mL/min/{1.73_m2} (ref >59)
GFR calc non Af Amer: 74 mL/min/{1.73_m2} (ref >59)
Globulin, Total: 2.2 g/dL (ref 1.5–4.5)
Glucose: 93 mg/dL (ref 65–99)
Potassium: 4.5 mmol/L (ref 3.5–5.2)
Sodium: 143 mmol/L (ref 134–144)
Total Protein: 6.3 g/dL (ref 6.0–8.5)

## 2020-02-10 LAB — CBC WITH DIFFERENTIAL/PLATELET
Basophils Absolute: 0.1 10*3/uL (ref 0.0–0.2)
Basos: 1 %
EOS (ABSOLUTE): 0.1 10*3/uL (ref 0.0–0.4)
Eos: 1 %
Hematocrit: 40.7 % (ref 37.5–51.0)
Hemoglobin: 14 g/dL (ref 13.0–17.7)
Immature Grans (Abs): 0 10*3/uL (ref 0.0–0.1)
Immature Granulocytes: 0 %
Lymphocytes Absolute: 1.8 10*3/uL (ref 0.7–3.1)
Lymphs: 36 %
MCH: 32.6 pg (ref 26.6–33.0)
MCHC: 34.4 g/dL (ref 31.5–35.7)
MCV: 95 fL (ref 79–97)
Monocytes Absolute: 0.5 10*3/uL (ref 0.1–0.9)
Monocytes: 11 %
Neutrophils Absolute: 2.5 10*3/uL (ref 1.4–7.0)
Neutrophils: 51 %
Platelets: 253 10*3/uL (ref 150–450)
RBC: 4.3 x10E6/uL (ref 4.14–5.80)
RDW: 12.8 % (ref 11.6–15.4)
WBC: 5 10*3/uL (ref 3.4–10.8)

## 2020-02-10 LAB — PSA: Prostate Specific Ag, Serum: 0.3 ng/mL (ref 0.0–4.0)

## 2020-02-10 LAB — CARDIOVASCULAR RISK ASSESSMENT

## 2020-02-10 LAB — TSH: TSH: 12.3 u[IU]/mL — ABNORMAL HIGH (ref 0.450–4.500)

## 2020-02-11 ENCOUNTER — Encounter: Payer: Self-pay | Admitting: Nurse Practitioner

## 2020-02-12 ENCOUNTER — Other Ambulatory Visit: Payer: Self-pay

## 2020-02-12 DIAGNOSIS — E039 Hypothyroidism, unspecified: Secondary | ICD-10-CM

## 2020-02-12 MED ORDER — LEVOTHYROXINE SODIUM 50 MCG PO TABS
50.0000 ug | ORAL_TABLET | Freq: Every day | ORAL | 1 refills | Status: DC
Start: 2020-02-12 — End: 2020-03-28

## 2020-03-02 ENCOUNTER — Other Ambulatory Visit: Payer: Self-pay | Admitting: Family Medicine

## 2020-03-22 ENCOUNTER — Other Ambulatory Visit: Payer: 59

## 2020-03-22 ENCOUNTER — Other Ambulatory Visit: Payer: Self-pay

## 2020-03-22 ENCOUNTER — Other Ambulatory Visit: Payer: Self-pay | Admitting: Nurse Practitioner

## 2020-03-22 DIAGNOSIS — E039 Hypothyroidism, unspecified: Secondary | ICD-10-CM

## 2020-03-23 LAB — TSH: TSH: 7.78 u[IU]/mL — ABNORMAL HIGH (ref 0.450–4.500)

## 2020-03-28 ENCOUNTER — Other Ambulatory Visit: Payer: Self-pay

## 2020-03-28 MED ORDER — LEVOTHYROXINE SODIUM 75 MCG PO TABS: 75.0000 ug | ORAL_TABLET | Freq: Every day | ORAL | 2 refills | Status: DC

## 2020-04-04 ENCOUNTER — Other Ambulatory Visit: Payer: Self-pay | Admitting: Family Medicine

## 2020-04-11 IMAGING — DX DG CHEST 1V PORT
1 series · 1 of 1 positions shown · non-contrast
Comparison: 05/13/2010

CLINICAL DATA: Shortness of breath. Syncopal episode.

EXAM:
PORTABLE CHEST 1 VIEW

[chest ap]
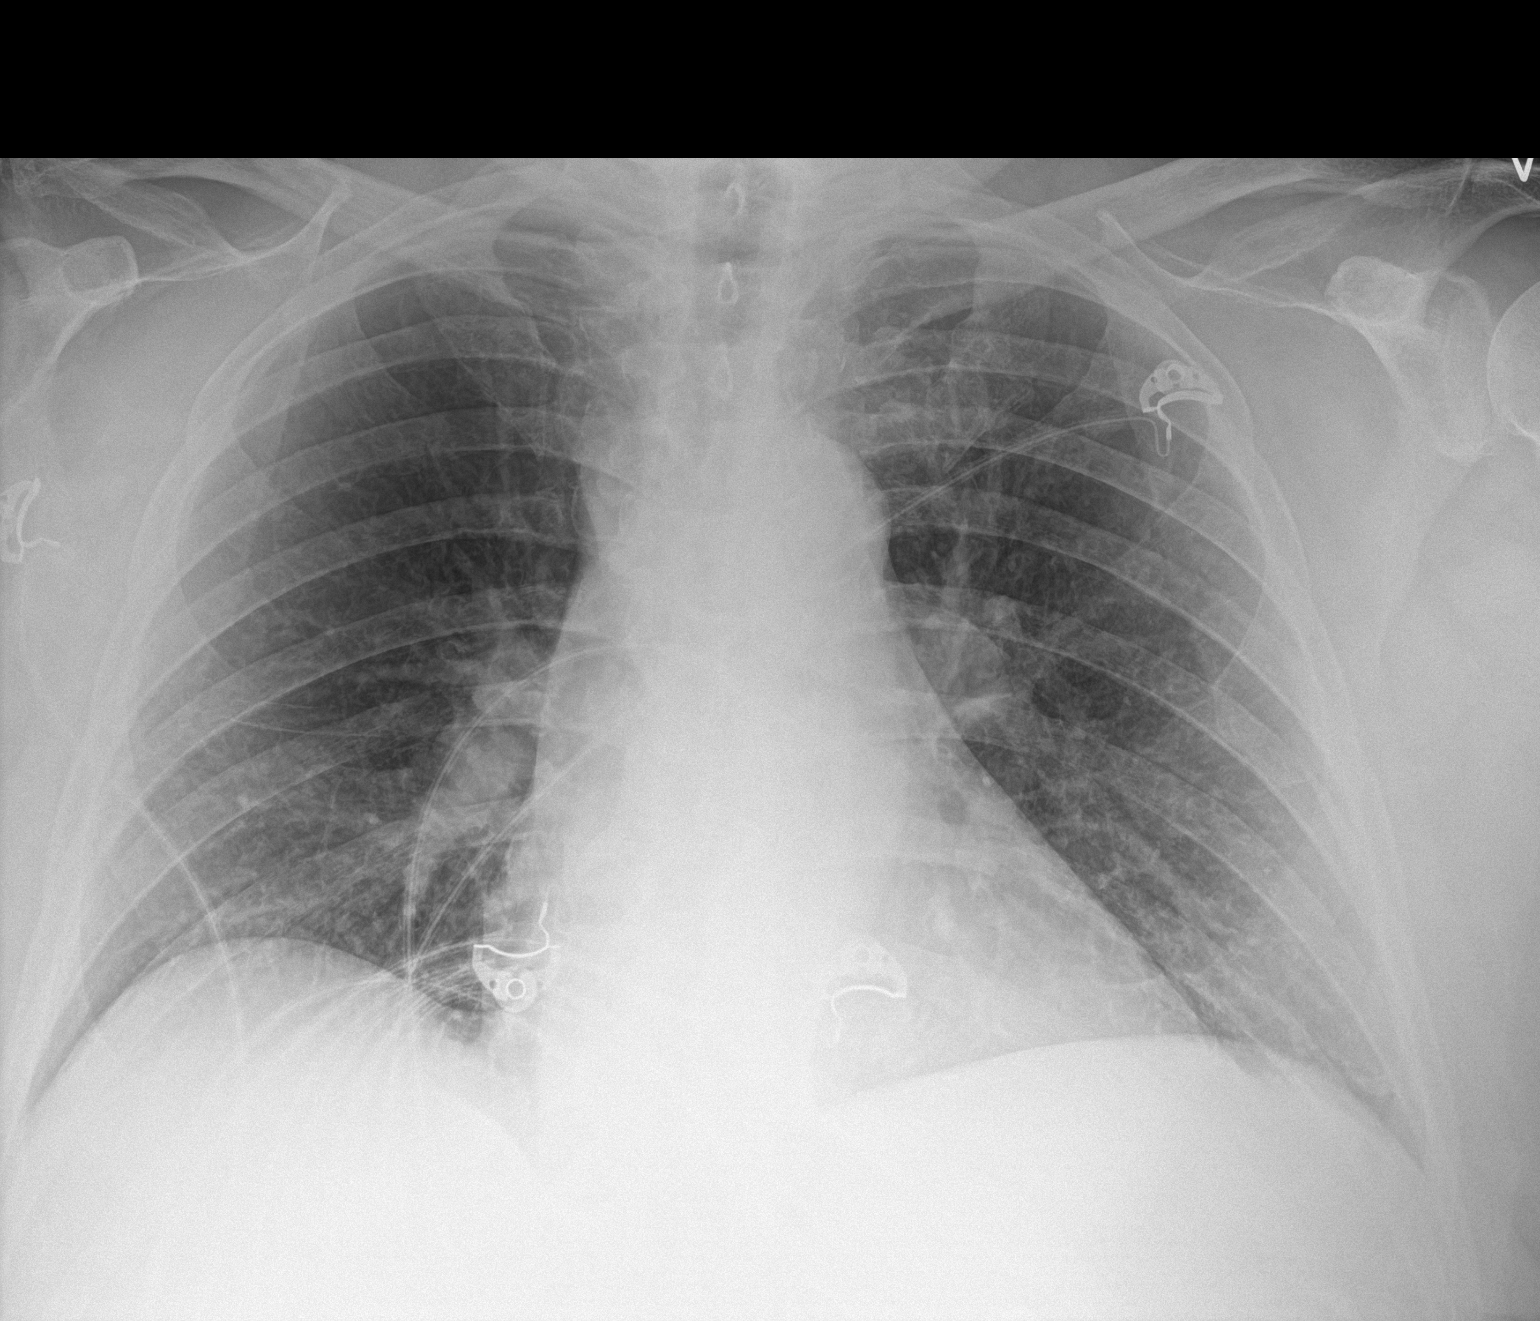

[1 of 1 positions shown; findings below may reference images not displayed]

FINDINGS: The cardiac silhouette remains borderline enlarged. Interval mild
prominence of the pulmonary vasculature and minimal prominence of
the interstitial markings. No pleural fluid or Kerley lines.
Thoracic spine degenerative changes.
IMPRESSION: Interval mild pulmonary vascular congestion and minimal chronic
interstitial lung disease.

## 2020-04-24 ENCOUNTER — Other Ambulatory Visit: Payer: Self-pay | Admitting: Family Medicine

## 2020-05-13 ENCOUNTER — Ambulatory Visit: Payer: 59

## 2020-05-16 ENCOUNTER — Ambulatory Visit: Payer: 59

## 2020-05-16 ENCOUNTER — Other Ambulatory Visit: Payer: Self-pay

## 2020-05-16 DIAGNOSIS — E039 Hypothyroidism, unspecified: Secondary | ICD-10-CM

## 2020-05-17 LAB — TSH: TSH: 8.64 u[IU]/mL — ABNORMAL HIGH (ref 0.450–4.500)

## 2020-05-20 ENCOUNTER — Other Ambulatory Visit: Payer: Self-pay

## 2020-05-20 MED ORDER — LEVOTHYROXINE SODIUM 88 MCG PO TABS: 88.0000 ug | ORAL_TABLET | Freq: Every day | ORAL | 3 refills | Status: DC

## 2020-06-02 ENCOUNTER — Other Ambulatory Visit: Payer: Self-pay | Admitting: Physician Assistant

## 2020-06-02 ENCOUNTER — Other Ambulatory Visit: Payer: Self-pay | Admitting: Nurse Practitioner

## 2020-06-23 ENCOUNTER — Other Ambulatory Visit: Payer: Self-pay | Admitting: Family Medicine

## 2020-06-28 ENCOUNTER — Other Ambulatory Visit: Payer: 59

## 2020-06-28 ENCOUNTER — Other Ambulatory Visit: Payer: Self-pay

## 2020-06-28 DIAGNOSIS — E039 Hypothyroidism, unspecified: Secondary | ICD-10-CM

## 2020-06-29 LAB — TSH: TSH: 5.57 u[IU]/mL — ABNORMAL HIGH (ref 0.450–4.500)

## 2020-07-04 ENCOUNTER — Other Ambulatory Visit: Payer: Self-pay | Admitting: Family Medicine

## 2020-07-04 NOTE — Telephone Encounter (Signed)
Your pt
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
## 2020-08-16 ENCOUNTER — Other Ambulatory Visit: Payer: Self-pay

## 2020-08-16 ENCOUNTER — Encounter: Payer: Self-pay | Admitting: Nurse Practitioner

## 2020-08-16 ENCOUNTER — Ambulatory Visit: Payer: 59 | Admitting: Nurse Practitioner

## 2020-08-16 ENCOUNTER — Ambulatory Visit: Payer: 59 | Admitting: Family Medicine

## 2020-08-16 VITALS — BP 112/70 | HR 76 | Temp 97.3°F | Resp 18 | Ht 70.0 in | Wt 288.4 lb

## 2020-08-16 DIAGNOSIS — E782 Mixed hyperlipidemia: Secondary | ICD-10-CM

## 2020-08-16 DIAGNOSIS — E039 Hypothyroidism, unspecified: Secondary | ICD-10-CM

## 2020-08-16 DIAGNOSIS — Z23 Encounter for immunization: Secondary | ICD-10-CM | POA: Diagnosis not present

## 2020-08-16 DIAGNOSIS — I10 Essential (primary) hypertension: Secondary | ICD-10-CM

## 2020-08-16 DIAGNOSIS — Z7901 Long term (current) use of anticoagulants: Secondary | ICD-10-CM | POA: Diagnosis not present

## 2020-08-16 DIAGNOSIS — R5383 Other fatigue: Secondary | ICD-10-CM

## 2020-08-16 NOTE — Patient Instructions (Signed)
Recommend screening eye exam We will call you with lab results and sleep study information Continue medications, pending lab results, may need to increase thyroid medication Continue heart healthy diet and physical activity Recommend Claritin 10 mg daily Avoid foods that trigger GERD Shingles vaccine given in office today, return in 2 months for 2nd Shingles vaccine Follow-up in 54-month  Zoster Vaccine, Recombinant injection What is this medication? ZOSTER VACCINE (ZOS ter vak SEEN) is a vaccine used to reduce the risk of getting shingles. This vaccine is not used to treat shingles or nerve pain fromshingles. This medicine may be used for other purposes; ask your health care provider orpharmacist if you have questions. COMMON BRAND NAME(S): [facility]What should I tell my care team before I take this medication? They need to know if you have any of these conditions: cancer immune system problems an unusual or allergic reaction to Zoster vaccine, other medications, foods, dyes, or preservatives pregnant or trying to get pregnant breast-feeding How should I use this medication? This vaccine is injected into a muscle. It is given by a health care provider. A copy of Vaccine Information Statements will be given before each vaccination. Be sure to read this information carefully each time. This sheet may changeoften. Talk to your health care provider about the use of this vaccine in children.This vaccine is not approved for use in children. Overdosage: If you think you have taken too much of this medicine contact apoison control center or emergency room at once. NOTE: This medicine is only for you. Do not share this medicine with others. What if I miss a dose? Keep appointments for follow-up (booster) doses. It is important not to miss your dose. Call your health care provider if you are unable to keep anappointment. What may interact with this medication? medicines that suppress your immune  system medicines to treat cancer steroid medicines like prednisone or cortisone This list may not describe all possible interactions. Give your health care provider a list of all the medicines, herbs, non-prescription drugs, or dietary supplements you use. Also tell them if you smoke, drink alcohol, or use illegaldrugs. Some items may interact with your medicine. What should I watch for while using this medication? Visit your health care provider regularly. This vaccine, like all vaccines, may not fully protect everyone. What side effects may I notice from receiving this medication? Side effects that you should report to your doctor or health care professionalas soon as possible: allergic reactions (skin rash, itching or hives; swelling of the face, lips, or tongue) trouble breathing Side effects that usually do not require medical attention (report these toyour doctor or health care professional if they continue or are bothersome): chills headache fever nausea pain, redness, or irritation at site where injected tiredness vomiting This list may not describe all possible side effects. Call your doctor for medical advice about side effects. You may report side effects to FDA at1-800-FDA-1088. Where should I keep my medication? This vaccine is only given by a health care provider. It will not be stored athome. NOTE: This sheet is a summary. It may not cover all possible information. If you have questions about this medicine, talk to your doctor, pharmacist, orhealth care provider.  2022 Elsevier/Gold Standard (2019-01-27 16:23:07)

## 2020-08-17 LAB — TSH: TSH: 5.68 u[IU]/mL — ABNORMAL HIGH (ref 0.450–4.500)

## 2020-08-17 LAB — CBC WITH DIFF/PLATELET
Basophils Absolute: 0.1 10*3/uL (ref 0.0–0.2)
Basos: 1 %
EOS (ABSOLUTE): 0.1 10*3/uL (ref 0.0–0.4)
Eos: 1 %
Hematocrit: 43.7 % (ref 37.5–51.0)
Hemoglobin: 14.6 g/dL (ref 13.0–17.7)
Immature Grans (Abs): 0 10*3/uL (ref 0.0–0.1)
Immature Granulocytes: 0 %
Lymphocytes Absolute: 1.7 10*3/uL (ref 0.7–3.1)
Lymphs: 32 %
MCH: 32.5 pg (ref 26.6–33.0)
MCHC: 33.4 g/dL (ref 31.5–35.7)
MCV: 97 fL (ref 79–97)
Monocytes Absolute: 0.5 10*3/uL (ref 0.1–0.9)
Monocytes: 9 %
Neutrophils Absolute: 3 10*3/uL (ref 1.4–7.0)
Neutrophils: 57 %
Platelets: 236 10*3/uL (ref 150–450)
RBC: 4.49 x10E6/uL (ref 4.14–5.80)
RDW: 13.2 % (ref 11.6–15.4)
WBC: 5.3 10*3/uL (ref 3.4–10.8)

## 2020-08-17 LAB — VITAMIN D 25 HYDROXY (VIT D DEFICIENCY, FRACTURES): Vit D, 25-Hydroxy: 46.9 ng/mL (ref 30.0–100.0)

## 2020-08-17 LAB — LIPID PANEL
Chol/HDL Ratio: 3 ratio (ref 0.0–5.0)
Cholesterol, Total: 114 mg/dL (ref 100–199)
HDL: 38 mg/dL — ABNORMAL LOW (ref >39)
LDL Chol Calc (NIH): 61 mg/dL (ref 0–99)
Triglycerides: 74 mg/dL (ref 0–149)
VLDL Cholesterol Cal: 15 mg/dL (ref 5–40)

## 2020-08-17 LAB — COMPREHENSIVE METABOLIC PANEL
ALT: 25 IU/L (ref 0–44)
AST: 29 IU/L (ref 0–40)
Albumin/Globulin Ratio: 1.6 (ref 1.2–2.2)
Albumin: 3.9 g/dL (ref 3.8–4.8)
Alkaline Phosphatase: 56 IU/L (ref 44–121)
BUN/Creatinine Ratio: 15 (ref 10–24)
BUN: 16 mg/dL (ref 8–27)
Bilirubin Total: 0.4 mg/dL (ref 0.0–1.2)
CO2: 23 mmol/L (ref 20–29)
Calcium: 9 mg/dL (ref 8.6–10.2)
Chloride: 105 mmol/L (ref 96–106)
Creatinine, Ser: 1.09 mg/dL (ref 0.76–1.27)
Globulin, Total: 2.5 g/dL (ref 1.5–4.5)
Glucose: 105 mg/dL — ABNORMAL HIGH (ref 65–99)
Potassium: 4.6 mmol/L (ref 3.5–5.2)
Sodium: 141 mmol/L (ref 134–144)
Total Protein: 6.4 g/dL (ref 6.0–8.5)
eGFR: 74 mL/min/{1.73_m2} (ref >59)

## 2020-08-17 LAB — CARDIOVASCULAR RISK ASSESSMENT

## 2020-08-19 ENCOUNTER — Other Ambulatory Visit: Payer: Self-pay | Admitting: Nurse Practitioner

## 2020-08-19 DIAGNOSIS — Z1211 Encounter for screening for malignant neoplasm of colon: Secondary | ICD-10-CM

## 2020-08-19 DIAGNOSIS — R7301 Impaired fasting glucose: Secondary | ICD-10-CM

## 2020-08-22 LAB — SPECIMEN STATUS REPORT

## 2020-08-22 LAB — HGB A1C W/O EAG: Hgb A1c MFr Bld: 5.8 % — ABNORMAL HIGH (ref 4.8–5.6)

## 2020-08-23 ENCOUNTER — Encounter: Payer: Self-pay | Admitting: Nurse Practitioner

## 2020-08-26 ENCOUNTER — Encounter: Payer: Self-pay | Admitting: Gastroenterology

## 2020-09-06 ENCOUNTER — Other Ambulatory Visit: Payer: Self-pay | Admitting: Physician Assistant

## 2020-09-06 ENCOUNTER — Other Ambulatory Visit: Payer: Self-pay | Admitting: Nurse Practitioner

## 2020-09-23 ENCOUNTER — Telehealth: Payer: 59 | Admitting: Nurse Practitioner

## 2020-09-23 ENCOUNTER — Encounter: Payer: Self-pay | Admitting: Nurse Practitioner

## 2020-09-23 VITALS — BP 124/82 | HR 86 | Temp 96.1°F | Ht 70.0 in | Wt 288.0 lb

## 2020-09-23 DIAGNOSIS — R059 Cough, unspecified: Secondary | ICD-10-CM

## 2020-09-23 DIAGNOSIS — Z20822 Contact with and (suspected) exposure to covid-19: Secondary | ICD-10-CM

## 2020-09-23 DIAGNOSIS — J069 Acute upper respiratory infection, unspecified: Secondary | ICD-10-CM

## 2020-09-23 LAB — POC COVID19 BINAXNOW: SARS Coronavirus 2 Ag: NEGATIVE
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
## 2020-09-25 ENCOUNTER — Telehealth: Payer: Self-pay | Admitting: Nurse Practitioner

## 2020-09-25 DIAGNOSIS — R059 Cough, unspecified: Secondary | ICD-10-CM

## 2020-09-25 DIAGNOSIS — U071 COVID-19: Secondary | ICD-10-CM

## 2020-09-25 MED ORDER — PROMETHAZINE-DM 6.25-15 MG/5ML PO SYRP: 5.0000 mL | ORAL_SOLUTION | Freq: Four times a day (QID) | ORAL | 0 refills | Status: DC | PRN

## 2020-09-25 NOTE — Telephone Encounter (Signed)
Pt notified triage nurse of positive Covid-19 home test. States he has not taken any medications for sinus congestion or cough. Denies dyspnea or chest pain. Recommended Mucinex OTC as directed, Flonase nasal spray, and will send cough medication to pharmacy. Pt verbalized understanding of all instructions.

## 2020-09-26 ENCOUNTER — Other Ambulatory Visit: Payer: Self-pay | Admitting: Physician Assistant

## 2020-09-27 ENCOUNTER — Encounter: Payer: Self-pay | Admitting: Nurse Practitioner

## 2020-10-01 ENCOUNTER — Encounter: Payer: Self-pay | Admitting: Nurse Practitioner

## 2020-10-02 ENCOUNTER — Telehealth: Payer: Self-pay | Admitting: Nurse Practitioner

## 2020-10-02 ENCOUNTER — Telehealth: Payer: Self-pay

## 2020-10-02 ENCOUNTER — Other Ambulatory Visit: Payer: Self-pay | Admitting: Nurse Practitioner

## 2020-10-02 DIAGNOSIS — U071 COVID-19: Secondary | ICD-10-CM

## 2020-10-02 DIAGNOSIS — R059 Cough, unspecified: Secondary | ICD-10-CM

## 2020-10-02 MED ORDER — PROMETHAZINE-DM 6.25-15 MG/5ML PO SYRP: 5.0000 mL | ORAL_SOLUTION | Freq: Four times a day (QID) | ORAL | 0 refills | Status: DC | PRN

## 2020-10-02 MED ORDER — AZITHROMYCIN 250 MG PO TABS: ORAL_TABLET | ORAL | 0 refills | Status: AC

## 2020-10-02 MED ORDER — ALBUTEROL SULFATE HFA 108 (90 BASE) MCG/ACT IN AERS
2.0000 | INHALATION_SPRAY | Freq: Four times a day (QID) | RESPIRATORY_TRACT | 0 refills | Status: DC | PRN
Start: 2020-10-02 — End: 2020-10-24

## 2020-10-02 NOTE — Telephone Encounter (Signed)
Pt called concerning continued cough and new low-grade fever after recent COVID-19 positive home test. CXR ordered and cough syrup prescription sent to pharmacy. Pt denies dyspnea.

## 2020-10-02 NOTE — Telephone Encounter (Signed)
Pt telephoned with CXR results, bilateral pneumonia,Z-pack and albuterol inhalers prescribed. Pt denies dyspnea. Will repeat CXR in 4-weeks. Pt received instructions to rest, push fluids, seek emergency care if symptoms become concerning. Pt verbalized understanding of all instructions.   Tonita Phoenix, DNP

## 2020-10-06 ENCOUNTER — Encounter: Payer: Self-pay | Admitting: Nurse Practitioner
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
## 2020-10-09 ENCOUNTER — Ambulatory Visit: Payer: 59 | Admitting: Nurse Practitioner

## 2020-10-09 ENCOUNTER — Other Ambulatory Visit: Payer: Self-pay

## 2020-10-09 ENCOUNTER — Encounter: Payer: Self-pay | Admitting: Nurse Practitioner

## 2020-10-09 VITALS — BP 118/70 | HR 89 | Temp 94.7°F | Ht 70.0 in | Wt 284.0 lb

## 2020-10-09 DIAGNOSIS — U071 COVID-19: Secondary | ICD-10-CM

## 2020-10-09 DIAGNOSIS — R0981 Nasal congestion: Secondary | ICD-10-CM | POA: Diagnosis not present

## 2020-10-09 DIAGNOSIS — J1282 Pneumonia due to coronavirus disease 2019: Secondary | ICD-10-CM

## 2020-10-09 DIAGNOSIS — R0609 Other forms of dyspnea: Secondary | ICD-10-CM

## 2020-10-09 DIAGNOSIS — J3089 Other allergic rhinitis: Secondary | ICD-10-CM

## 2020-10-09 DIAGNOSIS — Z1211 Encounter for screening for malignant neoplasm of colon: Secondary | ICD-10-CM

## 2020-10-09 MED ORDER — BUDESONIDE 32 MCG/ACT NA SUSP: 1.0000 | Freq: Every day | NASAL | 0 refills | Status: DC

## 2020-10-09 MED ORDER — TRELEGY ELLIPTA 200-62.5-25 MCG/INH IN AEPB: 1.0000 | INHALATION_SPRAY | Freq: Every day | RESPIRATORY_TRACT | 0 refills | Status: DC

## 2020-10-09 MED ORDER — LEVOCETIRIZINE DIHYDROCHLORIDE 5 MG PO TABS: 5.0000 mg | ORAL_TABLET | Freq: Every evening | ORAL | 0 refills | Status: DC

## 2020-10-09 NOTE — Patient Instructions (Addendum)
Use Trelegy Ellipta inhaler once daily, rinse mouth afterwards Use Albuterol inhaler as needed for shortness of breath Repeat chest x-ray in 3-weeks Use Nasocort nasal spray daily Take Xyzal 5 mg daily for sinus congestion/allergies Seek emergency medical care for any severe shortness of breath or any other concerning symptoms We will call you with lab results and colonoscopy referral Notify office if symptoms fail to improve or worsen Follow-up as needed    Community-Acquired Pneumonia, Adult Pneumonia is an infection of the lungs. It causes irritation and swelling in the airways of the lungs. Mucus and fluid may also build up inside the airways. This may cause coughing and trouble breathing. One type of pneumonia can happen while you are in a hospital. A different type can happen when you are not in a hospital (community-acquired pneumonia). What are the causes? This condition is caused by germs (viruses, bacteria, or fungi). Some types of germs can spread from person to person. Pneumonia is not thought to spread from person to person. What increases the risk? You are more likely to develop this condition if: You have a long-term (chronic) disease, such as: Disease of the lungs. This may be chronic obstructive pulmonary disease (COPD) or asthma. Heart failure. Cystic fibrosis. Diabetes. Kidney disease. Sickle cell disease. HIV. You have other health problems, such as: Your body's defense system (immune system) is weak. A condition that may cause you to breathe in fluids from your mouth and nose. You had your spleen taken out. You do not take good care of your teeth and mouth (poor dental hygiene). You use or have used tobacco products. You travel where the germs that cause this illness are common. You are near certain animals or the places they live. You are older than 67 years of age. What are the signs or symptoms? Symptoms of this condition include: A cough. A  fever. Sweating or chills. Chest pain, often when you breathe deeply or cough. Breathing problems, such as: Fast breathing. Trouble breathing. Shortness of breath. Feeling tired (fatigued). Muscle aches. How is this treated? Treatment for this condition depends on many things, such as: The cause of your illness. Your medicines. Your other health problems. Most adults can be treated at home. Sometimes, treatment must happen in a hospital. Treatment may include medicines to kill germs. Medicines may depend on which germ caused your illness. Very bad pneumonia is rare. If you get it, you may: Have a machine to help you breathe. Have fluid taken away from around your lungs. Follow these instructions at home: Medicines Take over-the-counter and prescription medicines only as told by your doctor. Take cough medicine only if you are losing sleep. Cough medicine can keep your body from taking mucus away from your lungs. If you were prescribed an antibiotic medicine, take it as told by your doctor. Do not stop taking the antibiotic even if you start to feel better. Lifestyle   Do not drink alcohol. Do not use any products that contain nicotine or tobacco, such as cigarettes, e-cigarettes, and chewing tobacco. If you need help quitting, ask your doctor. Eat a healthy diet. This includes a lot of vegetables, fruits, whole grains, low-fat dairy products, and low-fat (lean) protein. General instructions  Rest a lot. Sleep for at least 8 hours each night. Sleep with your head and neck raised. Put a few pillows under your head or sleep in a reclining chair. Return to your normal activities as told by your doctor. Ask your doctor what activities are safe  for you. Drink enough fluid to keep your pee (urine) pale yellow. If your throat is sore, rinse your mouth often with salt water. To make salt water, dissolve -1 tsp (3-6 g) of salt in 1 cup (237 mL) of warm water. Keep all follow-up visits as  told by your doctor. This is important. How is this prevented? You can lower your risk of pneumonia by: Getting the pneumonia shot (vaccine). These shots have different types and schedules. Ask your doctor what works best for you. Think about getting this shot if: You are older than 68 years of age. You are 71-36 years of age and: You are being treated for cancer. You have long-term lung disease. You have other problems that affect your body's defense system. Ask your doctor if you have one of these. Getting your flu shot every year. Ask your doctor which type of shot is best for you. Going to the dentist as often as told. Washing your hands often with soap and water for at least 20 seconds. If you cannot use soap and water, use hand sanitizer. Contact a doctor if: You have a fever. You lose sleep because your cough medicine does not help. Get help right away if: You are short of breath and this gets worse. You have more chest pain. Your sickness gets worse. This is very serious if: You are an older adult. Your body's defense system is weak. You cough up blood. These symptoms may be an emergency. Do not wait to see if the symptoms will go away. Get medical help right away. Call your local emergency services (911 in the [place] Do not drive yourself to the hospital. Summary Pneumonia is an infection of the lungs. Community-acquired pneumonia affects people who have not been in the hospital. Certain germs can cause this infection. This condition may be treated with medicines that kill germs. For very bad pneumonia, you may need a hospital stay and treatment to help with breathing. This information is not intended to replace advice given to you by your health care provider. Make sure you discuss any questions you have with your health care provider. Document Revised: 10/04/2018 Document Reviewed: 10/04/2018 Elsevier Patient Education  [place] of Breath,  Adult Shortness of breath means you have trouble breathing. Shortness of breath could be a sign of a medical problem. Follow these instructions at home:  Watch for any changes in your symptoms. Do not use any products that contain nicotine or tobacco, such as cigarettes, e-cigarettes, and chewing tobacco. Do not smoke. Smoking can cause shortness of breath. If you need help to quit smoking, ask your doctor. Avoid things that can make it harder to breathe, such as: Mold. Dust. Air pollution. Chemical smells. Things that can cause allergy symptoms (allergens), if you have allergies. Keep your living space clean. Use products that help remove mold and dust. Rest as needed. Slowly return to your normal activities. Take over-the-counter and prescription medicines only as told by your doctor. This includes oxygen therapy and inhaled medicines. Keep all follow-up visits as told by your doctor. This is important. Contact a doctor if: Your condition does not get better as soon as expected. You have a hard time doing your normal activities, even after you rest. You have new symptoms. Get help right away if: Your shortness of breath gets worse. You have trouble breathing when you are resting. You feel light-headed or you pass out (faint). You have a cough that is  not helped by medicines. You cough up blood. You have pain with breathing. You have pain in your chest, arms, shoulders, or belly (abdomen). You have a fever. You cannot walk up stairs. You cannot exercise the way you normally do. These symptoms may represent a serious problem that is an emergency. Do not wait to see if the symptoms will go away. Get medical help right away. Call your local emergency services (911 in the [place] Do not drive yourself to the hospital. Summary Shortness of breath is when you have trouble breathing enough air. It can be a sign of a medical problem. Avoid things that make it hard for you to breathe, such  as smoking, pollution, mold, and dust. Watch for any changes in your symptoms. Contact your doctor if you do not get better or you get worse. This information is not intended to replace advice given to you by your health care provider. Make sure you discuss any questions you have with your health care provider. Document Revised: 05/24/2017 Document Reviewed: 05/24/2017 Elsevier Patient Education  2022 [employer].

## 2020-10-10 LAB — COMPREHENSIVE METABOLIC PANEL
ALT: 104 IU/L — ABNORMAL HIGH (ref 0–44)
AST: 56 IU/L — ABNORMAL HIGH (ref 0–40)
Albumin/Globulin Ratio: 1.2 (ref 1.2–2.2)
Albumin: 3.4 g/dL — ABNORMAL LOW (ref 3.8–4.8)
Alkaline Phosphatase: 132 IU/L — ABNORMAL HIGH (ref 44–121)
BUN/Creatinine Ratio: 13 (ref 10–24)
BUN: 12 mg/dL (ref 8–27)
Bilirubin Total: 0.3 mg/dL (ref 0.0–1.2)
CO2: 23 mmol/L (ref 20–29)
Calcium: 8.8 mg/dL (ref 8.6–10.2)
Chloride: 107 mmol/L — ABNORMAL HIGH (ref 96–106)
Creatinine, Ser: 0.92 mg/dL (ref 0.76–1.27)
Globulin, Total: 2.9 g/dL (ref 1.5–4.5)
Glucose: 98 mg/dL (ref 70–99)
Potassium: 4.5 mmol/L (ref 3.5–5.2)
Sodium: 145 mmol/L — ABNORMAL HIGH (ref 134–144)
Total Protein: 6.3 g/dL (ref 6.0–8.5)
eGFR: 91 mL/min/{1.73_m2} (ref >59)

## 2020-10-10 LAB — CBC WITH DIFFERENTIAL/PLATELET
Basophils Absolute: 0.1 10*3/uL (ref 0.0–0.2)
Basos: 1 %
EOS (ABSOLUTE): 0.1 10*3/uL (ref 0.0–0.4)
Eos: 2 %
Hematocrit: 36.3 % — ABNORMAL LOW (ref 37.5–51.0)
Hemoglobin: 13.3 g/dL (ref 13.0–17.7)
Immature Grans (Abs): 0 10*3/uL (ref 0.0–0.1)
Immature Granulocytes: 0 %
Lymphocytes Absolute: 1.7 10*3/uL (ref 0.7–3.1)
Lymphs: 29 %
MCH: 35.7 pg — ABNORMAL HIGH (ref 26.6–33.0)
MCHC: 36.6 g/dL — ABNORMAL HIGH (ref 31.5–35.7)
MCV: 97 fL (ref 79–97)
Monocytes Absolute: 0.7 10*3/uL (ref 0.1–0.9)
Monocytes: 12 %
Neutrophils Absolute: 3.3 10*3/uL (ref 1.4–7.0)
Neutrophils: 56 %
Platelets: 444 10*3/uL (ref 150–450)
RBC: 3.73 x10E6/uL — ABNORMAL LOW (ref 4.14–5.80)
RDW: 12.5 % (ref 11.6–15.4)
WBC: 5.8 10*3/uL (ref 3.4–10.8)

## 2020-10-12 ENCOUNTER — Other Ambulatory Visit: Payer: Self-pay | Admitting: Nurse Practitioner

## 2020-10-12 DIAGNOSIS — R748 Abnormal levels of other serum enzymes: Secondary | ICD-10-CM

## 2020-10-17 ENCOUNTER — Ambulatory Visit (INDEPENDENT_AMBULATORY_CARE_PROVIDER_SITE_OTHER): Payer: 59

## 2020-10-17 ENCOUNTER — Other Ambulatory Visit: Payer: Self-pay | Admitting: Nurse Practitioner

## 2020-10-17 ENCOUNTER — Other Ambulatory Visit: Payer: Self-pay

## 2020-10-17 DIAGNOSIS — Z23 Encounter for immunization: Secondary | ICD-10-CM | POA: Diagnosis not present

## 2020-10-17 DIAGNOSIS — J3089 Other allergic rhinitis: Secondary | ICD-10-CM

## 2020-10-17 MED ORDER — LEVOCETIRIZINE DIHYDROCHLORIDE 5 MG PO TABS: 5.0000 mg | ORAL_TABLET | Freq: Every evening | ORAL | 1 refills | Status: DC

## 2020-10-22 ENCOUNTER — Encounter: Payer: Self-pay | Admitting: Nurse Practitioner

## 2020-10-23 ENCOUNTER — Ambulatory Visit: Payer: 59 | Admitting: Nurse Practitioner

## 2020-10-23 ENCOUNTER — Encounter: Payer: Self-pay | Admitting: Nurse Practitioner

## 2020-10-23 VITALS — BP 116/64 | HR 95 | Temp 97.3°F | Ht 70.0 in | Wt 284.0 lb

## 2020-10-23 DIAGNOSIS — U071 COVID-19: Secondary | ICD-10-CM | POA: Diagnosis not present

## 2020-10-23 DIAGNOSIS — J1282 Pneumonia due to coronavirus disease 2019: Secondary | ICD-10-CM

## 2020-10-23 DIAGNOSIS — R051 Acute cough: Secondary | ICD-10-CM | POA: Diagnosis not present

## 2020-10-23 DIAGNOSIS — R5383 Other fatigue: Secondary | ICD-10-CM

## 2020-10-23 DIAGNOSIS — J018 Other acute sinusitis: Secondary | ICD-10-CM | POA: Diagnosis not present

## 2020-10-23 DIAGNOSIS — J309 Allergic rhinitis, unspecified: Secondary | ICD-10-CM | POA: Diagnosis not present

## 2020-10-23 LAB — POCT RAPID STREP A (OFFICE): Rapid Strep A Screen: NEGATIVE

## 2020-10-23 MED ORDER — DOXYCYCLINE HYCLATE 100 MG PO TABS: 100.0000 mg | ORAL_TABLET | Freq: Two times a day (BID) | ORAL | 0 refills | Status: DC

## 2020-10-23 MED ORDER — HYDROCODONE BIT-HOMATROP MBR 5-1.5 MG/5ML PO SOLN: 5.0000 mL | Freq: Four times a day (QID) | ORAL | 0 refills | Status: DC | PRN
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
## 2020-10-23 NOTE — Patient Instructions (Addendum)
Continue Xyzal 5 mg daily for allergies Continue Flonase nasal spray daily Take Doxycycline 100 mg twice daily to 10 days Use Hydromet as prescribed for cough Replace toothbrushes and toothpaste Warm salt water gargles and throat lozenges as needed Obtain repeat [facility]-ray at [facility]    Sinusitis, Adult Sinusitis is soreness and swelling (inflammation) of your sinuses. Sinuses are hollow spaces in the bones around your face. They are located: Around your eyes. In the middle of your forehead. Behind your nose. In your cheekbones. Your sinuses and nasal passages are lined with a fluid called mucus. Mucus drains out of your sinuses. Swelling can trap mucus in your sinuses. This lets germs (bacteria, virus, or fungus) grow, which leads to infection. Most of the time, this condition is caused by a virus. What are the causes? This condition is caused by: Allergies. Asthma. Germs. Things that block your nose or sinuses. Growths in the nose (nasal polyps). Chemicals or irritants in the air. Fungus (rare). What increases the risk? You are more likely to develop this condition if: You have a weak body defense system (immune system). You do a lot of swimming or diving. You use nasal sprays too much. You smoke. What are the signs or symptoms? The main symptoms of this condition are pain and a feeling of pressure around the sinuses. Other symptoms include: Stuffy nose (congestion). Runny nose (drainage). Swelling and warmth in the sinuses. Headache. Toothache. A cough that may get worse at night. Mucus that collects in the throat or the back of the nose (postnasal drip). Being unable to smell and taste. Being very tired (fatigue). A fever. Sore throat. Bad breath. How is this diagnosed? This condition is diagnosed based on: Your symptoms. Your medical history. A physical exam. Tests to find out if your condition is short-term (acute) or long-term (chronic). Your  doctor may: Check your nose for growths (polyps). Check your sinuses using a tool that has a light (endoscope). Check for allergies or germs. Do imaging tests, such as an MRI or CT scan. How is this treated? Treatment for this condition depends on the cause and whether it is short-term or long-term. If caused by a virus, your symptoms should go away on their own within 10 days. You may be given medicines to relieve symptoms. They include: Medicines that shrink swollen tissue in the nose. Medicines that treat allergies (antihistamines). A spray that treats swelling of the nostrils.  Rinses that help get rid of thick mucus in your nose (nasal saline washes). If caused by bacteria, your doctor may wait to see if you will get better without treatment. You may be given antibiotic medicine if you have: A very bad infection. A weak body defense system. If caused by growths in the nose, you may need to have surgery. Follow these instructions at home: Medicines Take, use, or apply over-the-counter and prescription medicines only as told by your doctor. These may include nasal sprays. If you were prescribed an antibiotic medicine, take it as told by your doctor. Do not stop taking the antibiotic even if you start to feel better. Hydrate and humidify  Drink enough water to keep your pee (urine) pale yellow. Use a cool mist humidifier to keep the humidity level in your home above 50%. Breathe in steam for 10-15 minutes, 3-4 times a day, or as told by your doctor. You can do this in the bathroom while a hot shower is running. Try not to spend time in cool or dry  air. Rest Rest as much as you can. Sleep with your head raised (elevated). Make sure you get enough sleep each night. General instructions  Put a warm, moist washcloth on your face 3-4 times a day, or as often as told by your doctor. This will help with discomfort. Wash your hands often with soap and water. If there is no soap and water,  use hand sanitizer. Do not smoke. Avoid being around people who are smoking (secondhand smoke). Keep all follow-up visits as told by your doctor. This is important. Contact a doctor if: You have a fever. Your symptoms get worse. Your symptoms do not get better within 10 days. Get help right away if: You have a very bad headache. You cannot stop throwing up (vomiting). You have very bad pain or swelling around your face or eyes. You have trouble seeing. You feel confused. Your neck is stiff. You have trouble breathing. Summary Sinusitis is swelling of your sinuses. Sinuses are hollow spaces in the bones around your face. This condition is caused by tissues in your nose that become inflamed or swollen. This traps germs. These can lead to infection. If you were prescribed an antibiotic medicine, take it as told by your doctor. Do not stop taking it even if you start to feel better. Keep all follow-up visits as told by your doctor. This is important. This information is not intended to replace advice given to you by your health care provider. Make sure you discuss any questions you have with your health care provider. Document Revised: 05/24/2017 Document Reviewed: 05/24/2017 Elsevier Patient Education  2022 [employer].

## 2020-10-24 ENCOUNTER — Other Ambulatory Visit: Payer: Self-pay | Admitting: Nurse Practitioner

## 2020-10-24 DIAGNOSIS — J1282 Pneumonia due to coronavirus disease 2019: Secondary | ICD-10-CM

## 2020-10-24 DIAGNOSIS — U071 COVID-19: Secondary | ICD-10-CM

## 2020-10-24 LAB — COMPREHENSIVE METABOLIC PANEL
ALT: 24 IU/L (ref 0–44)
AST: 32 IU/L (ref 0–40)
Albumin/Globulin Ratio: 1.5 (ref 1.2–2.2)
Albumin: 3.8 g/dL (ref 3.8–4.8)
Alkaline Phosphatase: 78 IU/L (ref 44–121)
BUN/Creatinine Ratio: 14 (ref 10–24)
BUN: 15 mg/dL (ref 8–27)
Bilirubin Total: 0.2 mg/dL (ref 0.0–1.2)
CO2: 23 mmol/L (ref 20–29)
Calcium: 9 mg/dL (ref 8.6–10.2)
Chloride: 107 mmol/L — ABNORMAL HIGH (ref 96–106)
Creatinine, Ser: 1.09 mg/dL (ref 0.76–1.27)
Globulin, Total: 2.5 g/dL (ref 1.5–4.5)
Glucose: 119 mg/dL — ABNORMAL HIGH (ref 70–99)
Potassium: 4 mmol/L (ref 3.5–5.2)
Sodium: 142 mmol/L (ref 134–144)
Total Protein: 6.3 g/dL (ref 6.0–8.5)
eGFR: 74 mL/min/{1.73_m2} (ref >59)

## 2020-10-24 LAB — CBC WITH DIFFERENTIAL/PLATELET
Basophils Absolute: 0.1 10*3/uL (ref 0.0–0.2)
Basos: 1 %
EOS (ABSOLUTE): 0.1 10*3/uL (ref 0.0–0.4)
Eos: 2 %
Hematocrit: 38.5 % (ref 37.5–51.0)
Hemoglobin: 13.3 g/dL (ref 13.0–17.7)
Immature Grans (Abs): 0 10*3/uL (ref 0.0–0.1)
Immature Granulocytes: 0 %
Lymphocytes Absolute: 2.1 10*3/uL (ref 0.7–3.1)
Lymphs: 22 %
MCH: 33.6 pg — ABNORMAL HIGH (ref 26.6–33.0)
MCHC: 34.5 g/dL (ref 31.5–35.7)
MCV: 97 fL (ref 79–97)
Monocytes Absolute: 1 10*3/uL — ABNORMAL HIGH (ref 0.1–0.9)
Monocytes: 11 %
Neutrophils Absolute: 6.3 10*3/uL (ref 1.4–7.0)
Neutrophils: 64 %
Platelets: 265 10*3/uL (ref 150–450)
RBC: 3.96 x10E6/uL — ABNORMAL LOW (ref 4.14–5.80)
RDW: 12.8 % (ref 11.6–15.4)
WBC: 9.6 10*3/uL (ref 3.4–10.8)

## 2020-10-24 LAB — MONONUCLEOSIS SCREEN: Mono Screen: NEGATIVE

## 2020-10-27 ENCOUNTER — Encounter: Payer: Self-pay | Admitting: Nurse Practitioner
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
## 2020-10-30 ENCOUNTER — Encounter: Payer: Self-pay | Admitting: Nurse Practitioner

## 2020-10-30 ENCOUNTER — Ambulatory Visit: Payer: 59 | Admitting: Nurse Practitioner

## 2020-10-30 ENCOUNTER — Other Ambulatory Visit: Payer: Self-pay

## 2020-10-30 VITALS — BP 112/70 | HR 104 | Temp 96.1°F | Ht 70.0 in | Wt 280.0 lb

## 2020-10-30 DIAGNOSIS — B370 Candidal stomatitis: Secondary | ICD-10-CM

## 2020-10-30 DIAGNOSIS — R432 Parageusia: Secondary | ICD-10-CM

## 2020-10-30 DIAGNOSIS — F321 Major depressive disorder, single episode, moderate: Secondary | ICD-10-CM | POA: Diagnosis not present

## 2020-10-30 DIAGNOSIS — J1282 Pneumonia due to coronavirus disease 2019: Secondary | ICD-10-CM

## 2020-10-30 DIAGNOSIS — R051 Acute cough: Secondary | ICD-10-CM

## 2020-10-30 DIAGNOSIS — G4709 Other insomnia: Secondary | ICD-10-CM

## 2020-10-30 DIAGNOSIS — U071 COVID-19: Secondary | ICD-10-CM | POA: Diagnosis not present

## 2020-10-30 DIAGNOSIS — K146 Glossodynia: Secondary | ICD-10-CM

## 2020-10-30 MED ORDER — NYSTATIN 100000 UNIT/ML MT SUSP: 5.0000 mL | Freq: Four times a day (QID) | OROMUCOSAL | 0 refills | Status: DC

## 2020-10-30 MED ORDER — FLUCONAZOLE 150 MG PO TABS: 150.0000 mg | ORAL_TABLET | Freq: Once | ORAL | 0 refills | Status: AC

## 2020-10-30 MED ORDER — HYDROCODONE BIT-HOMATROP MBR 5-1.5 MG/5ML PO SOLN: 5.0000 mL | Freq: Four times a day (QID) | ORAL | 0 refills | Status: DC | PRN

## 2020-10-30 MED ORDER — AMITRIPTYLINE HCL 10 MG PO TABS: 10.0000 mg | ORAL_TABLET | Freq: Every day | ORAL | 0 refills | Status: DC

## 2020-10-30 NOTE — Patient Instructions (Addendum)
Take Diflucan 150 mg tablet Use Nystatin mouth wash for thrush Obtain outpatient chest x-ray We will call you lab results and chest x-ray results Use mouth wash four times daily Take Diflucan 150 mg one time for thrush Begin Amitryline 10 mg at bedtime for insomnia, do not take with other sedating medications or alcohol Insomnia Insomnia is a sleep disorder that makes it difficult to fall asleep or stay asleep. Insomnia can cause fatigue, low energy, difficulty concentrating, mood swings, and poor performance at work or school. There are three different ways to classify insomnia: Difficulty falling asleep. Difficulty staying asleep. Waking up too early in the morning. Any type of insomnia can be long-term (chronic) or short-term (acute). Both are common. Short-term insomnia usually lasts for three months or less. Chronic insomnia occurs at least three times a week for longer than three months. What are the causes? Insomnia may be caused by another condition, situation, or substance, such as: Anxiety. Certain medicines. Gastroesophageal reflux disease (GERD) or other gastrointestinal conditions. Asthma or other breathing conditions. Restless legs syndrome, sleep apnea, or other sleep disorders. Chronic pain. Menopause. Stroke. Abuse of alcohol, tobacco, or illegal drugs. Mental health conditions, such as depression. Caffeine. Neurological disorders, such as Alzheimer's disease. An overactive thyroid (hyperthyroidism). Sometimes, the cause of insomnia may not be known. What increases the risk? Risk factors for insomnia include: Gender. Women are affected more often than men. Age. Insomnia is more common as you get older. Stress. Lack of exercise. Irregular work schedule or working night shifts. Traveling between different time zones. Certain medical and mental health conditions. What are the signs or symptoms? If you have insomnia, the main symptom is having trouble falling  asleep or having trouble staying asleep. This may lead to other symptoms, such as: Feeling fatigued or having low energy. Feeling nervous about going to sleep. Not feeling rested in the morning. Having trouble concentrating. Feeling irritable, anxious, or depressed. How is this diagnosed? This condition may be diagnosed based on: Your symptoms and medical history. Your health care provider may ask about: Your sleep habits. Any medical conditions you have. Your mental health. A physical exam. How is this treated? Treatment for insomnia depends on the cause. Treatment may focus on treating an underlying condition that is causing insomnia. Treatment may also include: Medicines to help you sleep. Counseling or therapy. Lifestyle adjustments to help you sleep better. Follow these instructions at home: Eating and drinking  Limit or avoid alcohol, caffeinated beverages, and cigarettes, especially close to bedtime. These can disrupt your sleep. Do not eat a large meal or eat spicy foods right before bedtime. This can lead to digestive discomfort that can make it hard for you to sleep. Sleep habits  Keep a sleep diary to help you and your health care provider figure out what could be causing your insomnia. Write down: When you sleep. When you wake up during the night. How well you sleep. How rested you feel the next day. Any side effects of medicines you are taking. What you eat and drink. Make your bedroom a dark, comfortable place where it is easy to fall asleep. Put up shades or blackout curtains to block light from outside. Use a white noise machine to block noise. Keep the temperature cool. Limit screen use before bedtime. This includes: Watching TV. Using your smartphone, tablet, or computer. Stick to a routine that includes going to bed and waking up at the same times every day and night. This can help you fall  asleep faster. Consider making a quiet activity, such as reading,  part of your nighttime routine. Try to avoid taking naps during the day so that you sleep better at night. Get out of bed if you are still awake after 15 minutes of trying to sleep. Keep the lights down, but try reading or doing a quiet activity. When you feel sleepy, go back to bed. General instructions Take over-the-counter and prescription medicines only as told by your health care provider. Exercise regularly, as told by your health care provider. Avoid exercise starting several hours before bedtime. Use relaxation techniques to manage stress. Ask your health care provider to suggest some techniques that may work well for you. These may include: Breathing exercises. Routines to release muscle tension. Visualizing peaceful scenes. Make sure that you drive carefully. Avoid driving if you feel very sleepy. Keep all follow-up visits as told by your health care provider. This is important. Contact a health care provider if: You are tired throughout the day. You have trouble in your daily routine due to sleepiness. You continue to have sleep problems, or your sleep problems get worse. Get help right away if: You have serious thoughts about hurting yourself or someone else. If you ever feel like you may hurt yourself or others, or have thoughts about taking your own life, get help right away. You can go to your nearest emergency department or call: Your local emergency services (911 in the [place] A suicide crisis helpline, such as the [facility] [place] at 306-044-3866. This is open 24 hours a day. Summary Insomnia is a sleep disorder that makes it difficult to fall asleep or stay asleep. Insomnia can be long-term (chronic) or short-term (acute). Treatment for insomnia depends on the cause. Treatment may focus on treating an underlying condition that is causing insomnia. Keep a sleep diary to help you and your health care provider figure out what could be causing your  insomnia. This information is not intended to replace advice given to you by your health care provider. Make sure you discuss any questions you have with your health care provider. Document Revised: 11/02/2019 Document Reviewed: 11/02/2019 Elsevier Patient Education  2022 [employer]. Follow-up as needed  Quality Sleep Information, Adult Quality sleep is important for your mental and physical health. It also improves your quality of life. Quality sleep means you: Are asleep for most of the time you are in bed. Fall asleep within 30 minutes. Wake up no more than once a night.  Are awake for no longer than 20 minutes if you do wake up during the night. Most adults need 7-8 hours of quality sleep each night. How can poor sleep affect me? If you do not get enough quality sleep, you may have: Mood swings. Daytime sleepiness. Confusion. Decreased reaction time. Sleep disorders, such as insomnia and sleep apnea. Difficulty with: Solving problems. Coping with stress. Paying attention. These issues may affect your performance and productivity at work, school, and at home. Lack of sleep may also put you at higher risk for accidents, suicide, and risky behaviors. If you do not get quality sleep you may also be at higher risk for several health problems, including: Infections. Type 2 diabetes. Heart disease. High blood pressure. Obesity. Worsening of long-term conditions, like arthritis, kidney disease, depression, Parkinson's disease, and epilepsy. What actions can I take to get more quality sleep?   Stick to a sleep schedule. Go to sleep and wake up at about the same time each day. Do  not try to sleep less on weekdays and make up for lost sleep on weekends. This does not work. Try to get about 30 minutes of exercise on most days. Do not exercise 2-3 hours before going to bed. Limit naps during the day to 30 minutes or less. Do not use any products that contain nicotine or tobacco, such  as cigarettes or e-cigarettes. If you need help quitting, ask your health care provider. Do not drink caffeinated beverages for at least 8 hours before going to bed. Coffee, tea, and some sodas contain caffeine. Do not drink alcohol close to bedtime. Do not eat large meals close to bedtime. Do not take naps in the late afternoon. Try to get at least 30 minutes of sunlight every day. Morning sunlight is best. Make time to relax before bed. Reading, listening to music, or taking a hot bath promotes quality sleep. Make your bedroom a place that promotes quality sleep. Keep your bedroom dark, quiet, and at a comfortable room temperature. Make sure your bed is comfortable. Take out sleep distractions like TV, a computer, smartphone, and bright lights. If you are lying awake in bed for longer than 20 minutes, get up and do a relaxing activity until you feel sleepy. Work with your health care provider to treat medical conditions that may affect sleeping, such as: Nasal obstruction. Snoring. Sleep apnea and other sleep disorders. Talk to your health care provider if you think any of your prescription medicines may cause you to have difficulty falling or staying asleep. If you have sleep problems, talk with a sleep consultant. If you think you have a sleep disorder, talk with your health care provider about getting evaluated by a specialist. Where to find more information [place] website: https://sleepfoundation.org National Heart, Lung, and [place] ([place]): http://www.saunders.info/.pdf Centers for Disease Control and Prevention (CDC): LearningDermatology.pl Contact a health care provider if you: Have trouble getting to sleep or staying asleep. Often wake up very early in the morning and cannot get back to sleep. Have daytime sleepiness. Have daytime sleep attacks of suddenly falling asleep and sudden muscle weakness (narcolepsy). Have a  tingling sensation in your legs with a strong urge to move your legs (restless legs syndrome). Stop breathing briefly during sleep (sleep apnea). Think you have a sleep disorder or are taking a medicine that is affecting your quality of sleep. Summary Most adults need 7-8 hours of quality sleep each night. Getting enough quality sleep is an important part of health and well-being. Make your bedroom a place that promotes quality sleep and avoid things that may cause you to have poor sleep, such as alcohol, caffeine, smoking, and large meals. Talk to your health care provider if you have trouble falling asleep or staying asleep. This information is not intended to replace advice given to you by your health care provider. Make sure you discuss any questions you have with your health care provider. Document Revised: 03/31/2017 Document Reviewed: 03/31/2017 Elsevier Patient Education  2022 [place], Adult Oral thrush is an infection in your mouth and throat and on your tongue. It causes white patches to form in your mouth and on your tongue. Many cases of thrush are mild. But, sometimes, thrush can be serious. People who have a weak body defense system (immune system) or other diseases can be affected more. What are the causes? This condition is caused by a type of fungus called yeast. The fungus is normally present in small amounts in  the mouth and nose. If a person has a long-term illness or a weak body defense system, the fungus can grow and spread quickly. This causes thrush. What increases the risk? You are more likely to develop this condition if: You have a weak body defense system. You are an older adult. You have diabetes, cancer, or HIV. You have a dry mouth. You are pregnant or breastfeeding. You do not take good care of your teeth. This risk is greater for people who have false teeth (dentures). You use antibiotic or steroid medicines. What are the signs or  symptoms? Symptoms of this condition include: A burning feeling in the mouth and throat. White patches that stick to the mouth and tongue. A bad taste in the mouth or trouble tasting foods. A feeling like you have cotton in your mouth. Pain when you eat and swallow. Not wanting to eat as much as usual. Cracking at the corners of the mouth. How is this treated? This condition is treated with medicines called antifungals. These medicines prevent a fungus from growing. The medicines are either put right on the area (topical) or swallowed (oral). Your doctor will also treat other problems that you may have, such as diabetes or HIV. Follow these instructions at home: Medicines Take or use over-the-counter and prescription medicines only as told by your doctor. Ask your doctor about an over-the-counter medicine called gentian violet. Helping with pain and soreness To lessen your pain: Drink cold liquids, like water and iced tea. Eat frozen ice pops or frozen juices. Eat foods that are easy to swallow, like gelatin and ice cream. Drink from a straw if you have too much pain in your mouth.  General instructions Eat plain yogurt that has live cultures in it. Read the label to make sure that there are live cultures in your yogurt. If you wear false teeth: Take them out before you go to bed. Brush them well. Soak them in a cleaner. Rinse your mouth with warm salt-water many times a day. To make the salt-water mixture, dissolve -1 teaspoon (3-6 g) of salt in 1 cup (237 mL) of warm water. Contact a doctor if: Your problems do not get better within 7 days of treatment. Your infection is spreading. This may show as white areas on the skin outside of your mouth. You are nursing your baby and you have redness and pain in the nipples. Summary Oral thrush is an infection in your mouth and throat. It is caused by a fungus. You are more likely to get this condition if you have a weak body defense  system. Diseases like diabetes, cancer, or HIV also add to your risk. This condition is treated with medicines called antifungals. Contact a doctor if you do not get better within 7 days of starting treatment. This information is not intended to replace advice given to you by your health care provider. Make sure you discuss any questions you have with your health care provider. Document Revised: 10/28/2018 Document Reviewed: 10/28/2018 Elsevier Patient Education  2022 [place].   Vitamin B12 Deficiency Vitamin B12 deficiency means that your body does not have enough vitamin B12. The body needs this vitamin: To make red blood cells. To make genes (DNA). To help the nerves work. If you do not have enough vitamin B12 in your body, you can have health problems. What are the causes? Not eating enough foods that contain vitamin B12. Not being able to absorb vitamin B12 from the food that you eat.  Certain digestive system diseases. A condition in which the body does not make enough of a certain protein, which results in too few red blood cells (pernicious anemia). Having a surgery in which part of the stomach or small intestine is removed. Taking medicines that make it hard for the body to absorb vitamin B12. These medicines include: Heartburn medicines. Some antibiotic medicines. Other medicines that are used to treat certain conditions. What increases the risk? Being older than age 96. Eating a vegetarian or vegan diet, especially while you are pregnant. Eating a poor diet while you are pregnant. Taking certain medicines. Having alcoholism. What are the signs or symptoms? In some cases, there are no symptoms. If the condition leads to too few blood cells or nerve damage, symptoms can occur, such as: Feeling weak. Feeling tired (fatigued). Not being hungry. Weight loss. A loss of feeling (numbness) or tingling in your hands and feet. Redness and burning of the tongue. Being mixed  up (confused) or having memory problems. Sadness (depression). Problems with your senses. This can include color blindness, ringing in the ears, or loss of taste. Watery poop (diarrhea) or trouble pooping (constipation). Trouble walking. If anemia is very bad, symptoms can include: Being short of breath. Being dizzy. Having a very fast heartbeat. How is this treated? Changing the way you eat and drink, such as: Eating more foods that contain vitamin B12. Drinking little or no alcohol. Getting vitamin B12 shots. Taking vitamin B12 supplements. Your doctor will tell you the dose that is best for you. Follow these instructions at home: Eating and drinking  Eat lots of healthy foods that contain vitamin B12. These include: Meats and poultry, such as beef, pork, chicken, [place], and organ meats, such as liver. Seafood, such as clams, rainbow trout, salmon, tuna, and haddock. Eggs. Cereal and dairy products that have vitamin B12 added to them. Check the label. The items listed above may not be a complete list of what you can eat and drink. Contact a dietitian for more options. General instructions Get any shots as told by your doctor. Take supplements only as told by your doctor. Do not drink alcohol if your doctor tells you not to. In some cases, you may only be asked to limit alcohol use. Keep all follow-up visits as told by your doctor. This is important. Contact a doctor if: Your symptoms come back. Get help right away if: You have trouble breathing. You have a very fast heartbeat. You have chest pain. You get dizzy. You pass out. Summary Vitamin B12 deficiency means that your body is not getting enough vitamin B12. In some cases, there are no symptoms of this condition. Treatment may include making a change in the way you eat and drink, getting vitamin B12 shots, or taking supplements. Eat lots of healthy foods that contain vitamin B12. This information is not intended to  replace advice given to you by your health care provider. Make sure you discuss any questions you have with your health care provider. Document Revised: 08/31/2017 Document Reviewed: 08/31/2017 Elsevier Patient Education  2022 [employer]. o

## 2020-10-31 LAB — B12 AND FOLATE PANEL
Folate: >20 ng/mL (ref >3.0)
Vitamin B-12: 720 pg/mL (ref 232–1245)

## 2020-11-01 ENCOUNTER — Other Ambulatory Visit: Payer: Self-pay

## 2020-11-01 MED ORDER — AMOXICILLIN 875 MG PO TABS: 875.0000 mg | ORAL_TABLET | Freq: Two times a day (BID) | ORAL | 0 refills | Status: AC

## 2020-11-20 ENCOUNTER — Encounter: Payer: Self-pay | Admitting: Nurse Practitioner

## 2020-11-25 ENCOUNTER — Other Ambulatory Visit: Payer: Self-pay | Admitting: Nurse Practitioner

## 2020-11-26 ENCOUNTER — Other Ambulatory Visit: Payer: Self-pay | Admitting: Nurse Practitioner

## 2020-11-26 DIAGNOSIS — G4709 Other insomnia: Secondary | ICD-10-CM

## 2020-11-26 DIAGNOSIS — F321 Major depressive disorder, single episode, moderate: Secondary | ICD-10-CM

## 2020-12-08 ENCOUNTER — Other Ambulatory Visit: Payer: Self-pay | Admitting: Nurse Practitioner

## 2020-12-08 DIAGNOSIS — G4709 Other insomnia: Secondary | ICD-10-CM

## 2020-12-08 DIAGNOSIS — F321 Major depressive disorder, single episode, moderate: Secondary | ICD-10-CM

## 2021-02-11 LAB — HM COLONOSCOPY
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
## 2021-02-21 ENCOUNTER — Other Ambulatory Visit: Payer: Self-pay

## 2021-02-21 ENCOUNTER — Encounter: Payer: Self-pay | Admitting: Nurse Practitioner

## 2021-02-21 ENCOUNTER — Ambulatory Visit (INDEPENDENT_AMBULATORY_CARE_PROVIDER_SITE_OTHER): Payer: 59 | Admitting: Nurse Practitioner

## 2021-02-21 VITALS — BP 118/82 | HR 89 | Temp 97.3°F | Ht 70.0 in | Wt 290.8 lb

## 2021-02-21 DIAGNOSIS — Z7901 Long term (current) use of anticoagulants: Secondary | ICD-10-CM

## 2021-02-21 DIAGNOSIS — M79672 Pain in left foot: Secondary | ICD-10-CM

## 2021-02-21 DIAGNOSIS — I10 Essential (primary) hypertension: Secondary | ICD-10-CM

## 2021-02-21 DIAGNOSIS — E039 Hypothyroidism, unspecified: Secondary | ICD-10-CM | POA: Diagnosis not present

## 2021-02-21 DIAGNOSIS — R7303 Prediabetes: Secondary | ICD-10-CM

## 2021-02-21 DIAGNOSIS — K429 Umbilical hernia without obstruction or gangrene: Secondary | ICD-10-CM

## 2021-02-21 DIAGNOSIS — E782 Mixed hyperlipidemia: Secondary | ICD-10-CM | POA: Diagnosis not present

## 2021-02-21 LAB — HEMOGLOBIN A1C
Est. average glucose Bld gHb Est-mCnc: 131 mg/dL
Hgb A1c MFr Bld: 6.2 % — ABNORMAL HIGH (ref 4.8–5.6)

## 2021-02-21 NOTE — Patient Instructions (Addendum)
Rest, ice, and elevated left foot  Obtain left foot xray at [facility] Take Tylenol as needed for left foot pain Consider repair of abdominal hernia Continue medications We will call you with lab results   Insomnia Insomnia is a sleep disorder that makes it difficult to fall asleep or stay asleep. Insomnia can cause fatigue, low energy, difficulty concentrating, mood swings, and poor performance at work or school. There are three different ways to classify insomnia: Difficulty falling asleep. Difficulty staying asleep. Waking up too early in the morning. Any type of insomnia can be long-term (chronic) or short-term (acute). Both are common. Short-term insomnia usually lasts for three months or less. Chronic insomnia occurs at least three times a week for longer than three months. What are the causes? Insomnia may be caused by another condition, situation, or substance, such as: Anxiety. Certain medicines. Gastroesophageal reflux disease (GERD) or other gastrointestinal conditions. Asthma or other breathing conditions. Restless legs syndrome, sleep apnea, or other sleep disorders. Chronic pain. Menopause. Stroke. Abuse of alcohol, tobacco, or illegal drugs. Mental health conditions, such as depression. Caffeine. Neurological disorders, such as Alzheimer's disease. An overactive thyroid (hyperthyroidism). Sometimes, the cause of insomnia may not be known. What increases the risk? Risk factors for insomnia include: Gender. Women are affected more often than men. Age. Insomnia is more common as you get older. Stress. Lack of exercise. Irregular work schedule or working night shifts. Traveling between different time zones. Certain medical and mental health conditions. What are the signs or symptoms? If you have insomnia, the main symptom is having trouble falling asleep or having trouble staying asleep. This may lead to other symptoms, such as: Feeling fatigued or having  low energy. Feeling nervous about going to sleep. Not feeling rested in the morning. Having trouble concentrating. Feeling irritable, anxious, or depressed. How is this diagnosed? This condition may be diagnosed based on: Your symptoms and medical history. Your health care provider may ask about: Your sleep habits. Any medical conditions you have. Your mental health. A physical exam. How is this treated? Treatment for insomnia depends on the cause. Treatment may focus on treating an underlying condition that is causing insomnia. Treatment may also include: Medicines to help you sleep. Counseling or therapy. Lifestyle adjustments to help you sleep better. Follow these instructions at home: Eating and drinking  Limit or avoid alcohol, caffeinated beverages, and cigarettes, especially close to bedtime. These can disrupt your sleep. Do not eat a large meal or eat spicy foods right before bedtime. This can lead to digestive discomfort that can make it hard for you to sleep. Sleep habits  Keep a sleep diary to help you and your health care provider figure out what could be causing your insomnia. Write down: When you sleep. When you wake up during the night. How well you sleep. How rested you feel the next day. Any side effects of medicines you are taking. What you eat and drink. Make your bedroom a dark, comfortable place where it is easy to fall asleep. Put up shades or blackout curtains to block light from outside. Use a white noise machine to block noise. Keep the temperature cool. Limit screen use before bedtime. This includes: Watching TV. Using your smartphone, tablet, or computer. Stick to a routine that includes going to bed and waking up at the same times every day and night. This can help you fall asleep faster. Consider making a quiet activity, such as reading, part of your nighttime routine. Try to avoid  taking naps during the day so that you sleep better at night. Get  out of bed if you are still awake after 15 minutes of trying to sleep. Keep the lights down, but try reading or doing a quiet activity. When you feel sleepy, go back to bed. General instructions Take over-the-counter and prescription medicines only as told by your health care provider. Exercise regularly, as told by your health care provider. Avoid exercise starting several hours before bedtime. Use relaxation techniques to manage stress. Ask your health care provider to suggest some techniques that may work well for you. These may include: Breathing exercises. Routines to release muscle tension. Visualizing peaceful scenes. Make sure that you drive carefully. Avoid driving if you feel very sleepy. Keep all follow-up visits as told by your health care provider. This is important. Contact a health care provider if: You are tired throughout the day. You have trouble in your daily routine due to sleepiness. You continue to have sleep problems, or your sleep problems get worse. Get help right away if: You have serious thoughts about hurting yourself or someone else. If you ever feel like you may hurt yourself or others, or have thoughts about taking your own life, get help right away. You can go to your nearest emergency department or call: Your local emergency services (911 in the [place] A suicide crisis helpline, such as the [facility] at (587)241-3924 or 988 in the [place]. This is open 24 hours a day. Summary Insomnia is a sleep disorder that makes it difficult to fall asleep or stay asleep. Insomnia can be long-term (chronic) or short-term (acute). Treatment for insomnia depends on the cause. Treatment may focus on treating an underlying condition that is causing insomnia. Keep a sleep diary to help you and your health care provider figure out what could be causing your insomnia. This information is not intended to replace advice given to you by your health care  provider. Make sure you discuss any questions you have with your health care provider. Document Revised: 07/17/2020 Document Reviewed: 11/02/2019 Elsevier Patient Education  [place] [facility] Many things can cause foot pain. Some common causes are: An injury. A sprain. Arthritis. Blisters. Bunions. Follow these instructions at home: Managing pain, stiffness, and swelling If directed, put ice on the painful area: Put ice in a plastic bag. Place a towel between your skin and the bag. Leave the ice on for 20 minutes, 2-3 times a day.  Activity Do not stand or walk for long periods. Return to your normal activities as told by your health care provider. Ask your health care provider what activities are safe for you. Do stretches to relieve foot pain and stiffness as told by your health care provider. Do not lift anything that is heavier than 10 lb (4.5 kg), or the limit that you are told, until your health care provider says that it is safe. Lifting a lot of weight can put added pressure on your feet. Lifestyle Wear comfortable, supportive shoes that fit you well. Do not wear high heels. Keep your feet clean and dry. General instructions Take over-the-counter and prescription medicines only as told by your health care provider. Rub your foot gently. Pay attention to any changes in your symptoms. Keep all follow-up visits as told by your health care provider. This is important. Contact a health care provider if: Your pain does not get better after a few days of self-care. Your pain gets  worse. You cannot stand on your foot. Get help right away if: Your foot is numb or tingling. Your foot or toes are swollen. Your foot or toes turn white or blue. You have warmth and redness along your foot. Summary Common causes of foot pain are injury, sprain, arthritis, blisters, or bunions. Ice, medicines, and comfortable shoes may help foot pain. Contact your health care  provider if your pain does not get better after a few days of self-care. This information is not intended to replace advice given to you by your health care provider. Make sure you discuss any questions you have with your health care provider. Document Revised: 03/27/2020 Document Reviewed: 03/27/2020 Elsevier Patient Education  2022 [place].   Umbilical Hernia, Adult A hernia is a bulge of tissue that pushes through an opening between muscles. An umbilical hernia happens in the abdomen, near the belly button (umbilicus). The hernia may contain tissues from the small intestine, large intestine, or fatty tissue covering the intestines. Umbilical hernias in adults tend to get worse over time, and they require surgical treatment. There are different types of umbilical hernias, including: Indirect hernia. This type is located just above or below the umbilicus. It is the most common type of umbilical hernia in adults. Direct hernia. This type forms through an opening formed by the umbilicus. Reducible hernia. This type of hernia comes and goes. It may be visible only when you strain, lift something heavy, or cough. This type of hernia can be pushed back into the abdomen (reduced). Incarcerated hernia. This type traps abdominal tissue inside the hernia. This type of hernia cannot be reduced. Strangulated hernia. This type of hernia cuts off blood flow to the tissues inside the hernia. The tissues can start to die if this happens. This type of hernia requires emergency treatment. What are the causes? An umbilical hernia happens when tissue inside the abdomen presses on a weak area of the abdominal muscles. What increases the risk? You may have a greater risk of this condition if you: Are obese. Have had several pregnancies. Have a buildup of fluid inside your abdomen. Have had surgery that weakens the abdominal muscles. What are the signs or symptoms? The main symptom of this condition is a  painless bulge at or near the belly button. A reducible hernia may be visible only when you strain, lift something heavy, or cough. Other symptoms may include: Dull pain. A feeling of pressure. Symptoms of a strangulated hernia may include: Pain that gets increasingly worse. Nausea and vomiting. Pain when pressing on the hernia. Skin over the hernia becoming red or purple. Constipation. Blood in the stool. How is this diagnosed? This condition may be diagnosed based on: A physical exam. You may be asked to cough or strain while standing. These actions increase the pressure inside your abdomen and can force the hernia through the opening in your muscles. Your health care provider may try to reduce the hernia by pressing on it. Your symptoms and medical history. How is this treated? Surgery is the only treatment for an umbilical hernia. Surgery for a strangulated hernia is done as soon as possible. If you have a small hernia that is not incarcerated, you may need to lose weight before having surgery. Follow these instructions at home: Lose weight, if told by your health care provider. Do not try to push the hernia back in. Watch your hernia for any changes in color or size. Tell your health care provider if any changes occur.  You may need to avoid activities that increase pressure on your hernia. Do not lift anything that is heavier than 10 lb (4.5 kg), or the limit that you are told, until your health care provider says that it is safe. Take over-the-counter and prescription medicines only as told by your health care provider. Keep all follow-up visits. This is important. Contact a health care provider if: Your hernia gets larger. Your hernia becomes painful. Get help right away if: You develop sudden, severe pain near the area of your hernia. You have pain as well as nausea or vomiting. You have pain and the skin over your hernia changes color. You develop a fever or  chills. Summary A hernia is a bulge of tissue that pushes through an opening between muscles. An umbilical hernia happens near the belly button. Surgery is the only treatment for an umbilical hernia. Do not try to push your hernia back in. Keep all follow-up visits. This is important. This information is not intended to replace advice given to you by your health care provider. Make sure you discuss any questions you have with your health care provider. Document Revised: 07/31/2019 Document Reviewed: 07/31/2019 Elsevier Patient Education  [place].

## 2021-02-22 LAB — CBC WITH DIFF/PLATELET
Basophils Absolute: 0.1 10*3/uL (ref 0.0–0.2)
Basos: 1 %
EOS (ABSOLUTE): 0.1 10*3/uL (ref 0.0–0.4)
Eos: 2 %
Hematocrit: 43.2 % (ref 37.5–51.0)
Hemoglobin: 14.6 g/dL (ref 13.0–17.7)
Immature Grans (Abs): 0 10*3/uL (ref 0.0–0.1)
Immature Granulocytes: 0 %
Lymphocytes Absolute: 1.6 10*3/uL (ref 0.7–3.1)
Lymphs: 29 %
MCH: 32.5 pg (ref 26.6–33.0)
MCHC: 33.8 g/dL (ref 31.5–35.7)
MCV: 96 fL (ref 79–97)
Monocytes Absolute: 0.6 10*3/uL (ref 0.1–0.9)
Monocytes: 10 %
Neutrophils Absolute: 3.3 10*3/uL (ref 1.4–7.0)
Neutrophils: 58 %
Platelets: 239 10*3/uL (ref 150–450)
RBC: 4.49 x10E6/uL (ref 4.14–5.80)
RDW: 12.4 % (ref 11.6–15.4)
WBC: 5.6 10*3/uL (ref 3.4–10.8)

## 2021-02-22 LAB — LIPID PANEL
Chol/HDL Ratio: 3.2 ratio (ref 0.0–5.0)
Cholesterol, Total: 115 mg/dL (ref 100–199)
HDL: 36 mg/dL — ABNORMAL LOW (ref >39)
LDL Chol Calc (NIH): 60 mg/dL (ref 0–99)
Triglycerides: 101 mg/dL (ref 0–149)
VLDL Cholesterol Cal: 19 mg/dL (ref 5–40)

## 2021-02-22 LAB — COMPREHENSIVE METABOLIC PANEL
ALT: 21 IU/L (ref 0–44)
AST: 26 IU/L (ref 0–40)
Albumin/Globulin Ratio: 1.5 (ref 1.2–2.2)
Albumin: 4 g/dL (ref 3.8–4.8)
Alkaline Phosphatase: 67 IU/L (ref 44–121)
BUN/Creatinine Ratio: 12 (ref 10–24)
BUN: 12 mg/dL (ref 8–27)
Bilirubin Total: 0.3 mg/dL (ref 0.0–1.2)
CO2: 24 mmol/L (ref 20–29)
Calcium: 9.2 mg/dL (ref 8.6–10.2)
Chloride: 106 mmol/L (ref 96–106)
Creatinine, Ser: 1.01 mg/dL (ref 0.76–1.27)
Globulin, Total: 2.6 g/dL (ref 1.5–4.5)
Glucose: 97 mg/dL (ref 70–99)
Potassium: 4.4 mmol/L (ref 3.5–5.2)
Sodium: 143 mmol/L (ref 134–144)
Total Protein: 6.6 g/dL (ref 6.0–8.5)
eGFR: 81 mL/min/{1.73_m2} (ref >59)

## 2021-02-22 LAB — TSH: TSH: 6.65 u[IU]/mL — ABNORMAL HIGH (ref 0.450–4.500)

## 2021-02-22 LAB — CARDIOVASCULAR RISK ASSESSMENT

## 2021-02-24 ENCOUNTER — Other Ambulatory Visit: Payer: Self-pay

## 2021-02-24 MED ORDER — METFORMIN HCL 500 MG PO TABS: 500.0000 mg | ORAL_TABLET | Freq: Two times a day (BID) | ORAL | 0 refills | Status: DC

## 2021-02-24 NOTE — Telephone Encounter (Deleted)
{  [facility] TELEPHONE NOTE:[phone]}

## 2021-02-24 NOTE — Telephone Encounter (Signed)
This encounter was created in error - please disregard.

## 2021-02-27 ENCOUNTER — Encounter: Payer: Self-pay | Admitting: Nurse Practitioner

## 2021-03-08 ENCOUNTER — Other Ambulatory Visit: Payer: Self-pay | Admitting: Nurse Practitioner

## 2021-03-10 ENCOUNTER — Encounter: Payer: Self-pay | Admitting: Nurse Practitioner

## 2021-03-10 ENCOUNTER — Other Ambulatory Visit: Payer: Self-pay

## 2021-03-10 DIAGNOSIS — M19072 Primary osteoarthritis, left ankle and foot: Secondary | ICD-10-CM

## 2021-03-23 ENCOUNTER — Other Ambulatory Visit: Payer: Self-pay | Admitting: Nurse Practitioner

## 2021-04-14 ENCOUNTER — Other Ambulatory Visit: Payer: Self-pay | Admitting: Nurse Practitioner

## 2021-04-26 ENCOUNTER — Other Ambulatory Visit: Payer: Self-pay | Admitting: Nurse Practitioner

## 2021-04-26 DIAGNOSIS — J3089 Other allergic rhinitis: Secondary | ICD-10-CM

## 2021-04-29 ENCOUNTER — Encounter: Payer: Self-pay | Admitting: Nurse Practitioner

## 2021-05-25 ENCOUNTER — Other Ambulatory Visit: Payer: Self-pay | Admitting: Nurse Practitioner

## 2021-06-14 ENCOUNTER — Other Ambulatory Visit: Payer: Self-pay | Admitting: Nurse Practitioner

## 2021-06-17 ENCOUNTER — Encounter: Payer: Self-pay | Admitting: Nurse Practitioner

## 2021-06-18 ENCOUNTER — Other Ambulatory Visit: Payer: Self-pay

## 2021-06-18 MED ORDER — APIXABAN 5 MG PO TABS: 5.0000 mg | ORAL_TABLET | Freq: Two times a day (BID) | ORAL | 3 refills | Status: DC

## 2021-06-21 ENCOUNTER — Other Ambulatory Visit: Payer: Self-pay | Admitting: Nurse Practitioner

## 2021-07-22 ENCOUNTER — Telehealth: Payer: Self-pay | Admitting: Nurse Practitioner

## 2021-07-22 NOTE — Telephone Encounter (Signed)
left voicemail for pt to call to rs appt due provider not in the office

## 2021-08-15 ENCOUNTER — Ambulatory Visit (INDEPENDENT_AMBULATORY_CARE_PROVIDER_SITE_OTHER): Payer: 59 | Admitting: Nurse Practitioner

## 2021-08-15 ENCOUNTER — Encounter: Payer: Self-pay | Admitting: Nurse Practitioner

## 2021-08-15 VITALS — BP 122/70 | HR 88 | Temp 97.3°F | Ht 70.0 in | Wt 294.0 lb

## 2021-08-15 DIAGNOSIS — I10 Essential (primary) hypertension: Secondary | ICD-10-CM

## 2021-08-15 DIAGNOSIS — Z125 Encounter for screening for malignant neoplasm of prostate: Secondary | ICD-10-CM

## 2021-08-15 DIAGNOSIS — R110 Nausea: Secondary | ICD-10-CM

## 2021-08-15 DIAGNOSIS — R7303 Prediabetes: Secondary | ICD-10-CM

## 2021-08-15 DIAGNOSIS — E782 Mixed hyperlipidemia: Secondary | ICD-10-CM | POA: Diagnosis not present

## 2021-08-15 DIAGNOSIS — Z6841 Body Mass Index (BMI) 40.0 and over, adult: Secondary | ICD-10-CM

## 2021-08-15 DIAGNOSIS — J309 Allergic rhinitis, unspecified: Secondary | ICD-10-CM

## 2021-08-15 DIAGNOSIS — E039 Hypothyroidism, unspecified: Secondary | ICD-10-CM

## 2021-08-15 MED ORDER — SEMAGLUTIDE-WEIGHT MANAGEMENT 1.7 MG/0.75ML ~~LOC~~ SOAJ: 1.7000 mg | SUBCUTANEOUS | 0 refills | Status: DC

## 2021-08-15 MED ORDER — SEMAGLUTIDE-WEIGHT MANAGEMENT 2.4 MG/0.75ML ~~LOC~~ SOAJ: 2.4000 mg | SUBCUTANEOUS | 0 refills | Status: DC

## 2021-08-15 MED ORDER — ONDANSETRON HCL 4 MG PO TABS: 4.0000 mg | ORAL_TABLET | Freq: Three times a day (TID) | ORAL | 0 refills | Status: DC | PRN

## 2021-08-15 MED ORDER — LORATADINE 10 MG PO TABS: 10.0000 mg | ORAL_TABLET | Freq: Every day | ORAL | 11 refills | Status: DC

## 2021-08-15 MED ORDER — SEMAGLUTIDE-WEIGHT MANAGEMENT 1 MG/0.5ML ~~LOC~~ SOAJ: 1.0000 mg | SUBCUTANEOUS | 0 refills | Status: AC

## 2021-08-15 MED ORDER — SEMAGLUTIDE-WEIGHT MANAGEMENT 0.25 MG/0.5ML ~~LOC~~ SOAJ: 0.2500 mg | SUBCUTANEOUS | 0 refills | Status: AC

## 2021-08-15 MED ORDER — SEMAGLUTIDE-WEIGHT MANAGEMENT 0.5 MG/0.5ML ~~LOC~~ SOAJ: 0.5000 mg | SUBCUTANEOUS | 0 refills | Status: AC

## 2021-08-15 NOTE — Patient Instructions (Addendum)
Begin Wegovy 0.25 mg injection weekly for 4 weeks, then increase to 0.5 mg injection for 4 weeks, then increase to 1 mg injection weekly for 4 weeks, then increase to 1.7 mg injection weekly for 4 weeks, then increase to 2 .4 mg injection weekly  Take Zofran 4 mg as needed for nausea  Recommend stool softener daily to prevent constipation Prediabetes diet We will call you with lab results Recommend routine eye exam Follow-up in 48-month  Prediabetes Eating Plan Prediabetes is a condition that causes blood sugar (glucose) levels to be higher than normal. This increases the risk for developing type 2 diabetes (type 2 diabetes mellitus). Working with a health care provider or nutrition specialist (dietitian) to make diet and lifestyle changes can help prevent the onset of diabetes. These changes may help you: Control your blood glucose levels. Improve your cholesterol levels. Manage your blood pressure. What are tips for following this plan? Reading food labels Read food labels to check the amount of fat, salt (sodium), and sugar in prepackaged foods. Avoid foods that have: Saturated fats. Trans fats. Added sugars. Avoid foods that have more than 300 milligrams (mg) of sodium per serving. Limit your sodium intake to less than 2,300 mg each day. Shopping Avoid buying pre-made and processed foods. Avoid buying drinks with added sugar. Cooking Cook with olive oil. Do not use butter, lard, or ghee. Bake, broil, grill, steam, or boil foods. Avoid frying. Meal planning  Work with your dietitian to create an eating plan that is right for you. This may include tracking how many calories you take in each day. Use a food diary, notebook, or mobile application to track what you eat at each meal. Consider following a Mediterranean diet. This includes: Eating several servings of fresh fruits and vegetables each day. Eating fish at least twice a week. Eating one serving each day of whole grains,  beans, nuts, and seeds. Using olive oil instead of other fats. Limiting alcohol. Limiting red meat. Using nonfat or low-fat dairy products. Consider following a plant-based diet. This includes dietary choices that focus on eating mostly vegetables and fruit, grains, beans, nuts, and seeds. If you have high blood pressure, you may need to limit your sodium intake or follow a diet such as the DASH (Dietary Approaches to Stop Hypertension) eating plan. The DASH diet aims to lower high blood pressure. Lifestyle Set weight loss goals with help from your health care team. It is recommended that most people with prediabetes lose 7% of their body weight. Exercise for at least 30 minutes 5 or more days a week. Attend a support group or seek support from a mental health counselor. Take over-the-counter and prescription medicines only as told by your health care provider. What foods are recommended? Fruits Berries. Bananas. Apples. Oranges. Grapes. Papaya. Mango. Pomegranate. Kiwi. Grapefruit. Cherries. Vegetables Lettuce. Spinach. Peas. Beets. Cauliflower. Cabbage. Broccoli. Carrots. Tomatoes. Squash. Eggplant. Herbs. Peppers. Onions. Cucumbers. Brussels sprouts. Grains Whole grains, such as whole-wheat or whole-grain breads, crackers, cereals, and pasta. Unsweetened oatmeal. Bulgur. Barley. Quinoa. Brown rice. Corn or whole-wheat flour tortillas or taco shells. Meats and other proteins Seafood. Poultry without skin. Lean cuts of pork and beef. Tofu. Eggs. Nuts. Beans. Dairy Low-fat or fat-free dairy products, such as yogurt, cottage cheese, and cheese. Beverages Water. Tea. Coffee. Sugar-free or diet soda. Seltzer water. Low-fat or nonfat milk. Milk alternatives, such as soy or almond milk. Fats and oils Olive oil. Canola oil. Sunflower oil. Grapeseed oil. Avocado. Walnuts. Sweets and desserts  Sugar-free or low-fat pudding. Sugar-free or low-fat ice cream and other frozen treats. Seasonings and  condiments Herbs. Sodium-free spices. Mustard. Relish. Low-salt, low-sugar ketchup. Low-salt, low-sugar barbecue sauce. Low-fat or fat-free mayonnaise. The items listed above may not be a complete list of recommended foods and beverages. Contact a dietitian for more information. What foods are not recommended? Fruits Fruits canned with syrup. Vegetables Canned vegetables. Frozen vegetables with butter or cream sauce. Grains Refined white flour and flour products, such as bread, pasta, snack foods, and cereals. Meats and other proteins Fatty cuts of meat. Poultry with skin. Breaded or fried meat. Processed meats. Dairy Full-fat yogurt, cheese, or milk. Beverages Sweetened drinks, such as iced tea and soda. Fats and oils Butter. Lard. Ghee. Sweets and desserts Baked goods, such as cake, cupcakes, pastries, cookies, and cheesecake. Seasonings and condiments Spice mixes with added salt. Ketchup. Barbecue sauce. Mayonnaise. The items listed above may not be a complete list of foods and beverages that are not recommended. Contact a dietitian for more information. Where to find more information American Diabetes Association: www.diabetes.org Summary You may need to make diet and lifestyle changes to help prevent the onset of diabetes. These changes can help you control blood sugar, improve cholesterol levels, and manage blood pressure. Set weight loss goals with help from your health care team. It is recommended that most people with prediabetes lose 7% of their body weight. Consider following a Mediterranean diet. This includes eating plenty of fresh fruits and vegetables, whole grains, beans, nuts, seeds, fish, and low-fat dairy, and using olive oil instead of other fats. This information is not intended to replace advice given to you by your health care provider. Make sure you discuss any questions you have with your health care provider. Document Revised: 03/23/2019 Document Reviewed:  03/23/2019 Elsevier Patient Education  [facility] Injection (Weight Management) What is this medication? SEMAGLUTIDE (SEM a GLOO tide) promotes weight loss. It may also be used to maintain weight loss. It works by decreasing appetite. Changes to diet and exercise are often combined with this medication. This medicine may be used for other purposes; ask your health care provider or pharmacist if you have questions. COMMON BRAND NAME(S): OQHUTM What should I tell my care team before I take this medication? They need to know if you have any of these conditions: Endocrine tumors (MEN 2) or if someone in your family had these tumors Eye disease, vision problems Gallbladder disease History of depression or mental health disease History of pancreatitis Kidney disease Stomach or intestine problems Suicidal thoughts, plans, or attempt; a previous suicide attempt by you or a family member Thyroid cancer or if someone in your family had thyroid cancer An unusual or allergic reaction to semaglutide, other medications, foods, dyes, or preservatives Pregnant or trying to get pregnant Breast-feeding How should I use this medication? This medication is injected under the skin. You will be taught how to prepare and give it. Take it as directed on the prescription label. It is given once every week (every 7 days). Keep taking it unless your care team tells you to stop. It is important that you put your used needles and pens in a special sharps container. Do not put them in a trash can. If you do not have a sharps container, call your pharmacist or care team to get one. A special MedGuide will be given to you by the pharmacist with each prescription and refill. Be sure to read this information  carefully each time. This medication comes with INSTRUCTIONS FOR USE. Ask your pharmacist for directions on how to use this medication. Read the information carefully. Talk to your pharmacist or  care team if you have questions. Talk to your care team about the use of this medication in children. While it may be prescribed for children as young as 12 years for selected conditions, precautions do apply. Overdosage: If you think you have taken too much of this medicine contact a poison control center or emergency room at once. NOTE: This medicine is only for you. Do not share this medicine with others. What if I miss a dose? If you miss a dose and the next scheduled dose is more than 2 days away, take the missed dose as soon as possible. If you miss a dose and the next scheduled dose is less than 2 days away, do not take the missed dose. Take the next dose at your regular time. Do not take double or extra doses. If you miss your dose for 2 weeks or more, take the next dose at your regular time or call your care team to talk about how to restart this medication. What may interact with this medication? Insulin and other medications for diabetes This list may not describe all possible interactions. Give your health care provider a list of all the medicines, herbs, non-prescription drugs, or dietary supplements you use. Also tell them if you smoke, drink alcohol, or use illegal drugs. Some items may interact with your medicine. What should I watch for while using this medication? Visit your care team for regular checks on your progress. It may be some time before you see the benefit from this medication. Drink plenty of fluids while taking this medication. Check with your care team if you have severe diarrhea, nausea, and vomiting, or if you sweat a lot. The loss of too much body fluid may make it dangerous for you to take this medication. This medication may affect blood sugar levels. Ask your care team if changes in diet or medications are needed if you have diabetes. If you or your family notice any changes in your behavior, such as new or worsening depression, thoughts of harming yourself,  anxiety, other unusual or disturbing thoughts, or memory loss, call your care team right away. Women should inform their care team if they wish to become pregnant or think they might be pregnant. Losing weight while pregnant is not advised and may cause harm to the unborn child. Talk to your care team for more information. What side effects may I notice from receiving this medication? Side effects that you should report to your care team as soon as possible: Allergic reactions--skin rash, itching, hives, swelling of the face, lips, tongue, or throat Change in vision Dehydration--increased thirst, dry mouth, feeling faint or lightheaded, headache, dark yellow or brown urine Gallbladder problems--severe stomach pain, nausea, vomiting, fever Heart palpitations--rapid, pounding, or irregular heartbeat Kidney injury--decrease in the amount of urine, swelling of the ankles, hands, or feet Pancreatitis--severe stomach pain that spreads to your back or gets worse after eating or when touched, fever, nausea, vomiting Thoughts of suicide or self-harm, worsening mood, feelings of depression Thyroid cancer--new mass or lump in the neck, pain or trouble swallowing, trouble breathing, hoarseness Side effects that usually do not require medical attention (report to your care team if they continue or are bothersome): Diarrhea Loss of appetite Nausea Stomach pain Vomiting This list may not describe all possible side effects. Call  your doctor for medical advice about side effects. You may report side effects to FDA at 1-800-FDA-1088. Where should I keep my medication? Keep out of the reach of children and pets. Refrigeration (preferred): Store in the refrigerator. Do not freeze. Keep this medication in the original container until you are ready to take it. Get rid of any unused medication after the expiration date. Room temperature: If needed, prior to cap removal, the pen can be stored at room temperature for  up to 28 days. Protect from light. If it is stored at room temperature, get rid of any unused medication after 28 days or after it expires, whichever is first. It is important to get rid of the medication as soon as you no longer need it or it is expired. You can do this in two ways: Take the medication to a medication take-back program. Check with your pharmacy or law enforcement to find a location. If you cannot return the medication, follow the directions in the [facility]. NOTE: This sheet is a summary. It may not cover all possible information. If you have questions about this medicine, talk to your doctor, pharmacist, or health care provider.  2023 Elsevier/Gold Standard (2020-03-07 00:00:00)
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
## 2021-08-16 LAB — HEMOGLOBIN A1C
Est. average glucose Bld gHb Est-mCnc: 123 mg/dL
Hgb A1c MFr Bld: 5.9 % — ABNORMAL HIGH (ref 4.8–5.6)

## 2021-08-16 LAB — CBC WITH DIFFERENTIAL/PLATELET
Basophils Absolute: 0.1 10*3/uL (ref 0.0–0.2)
Basos: 1 %
EOS (ABSOLUTE): 0.1 10*3/uL (ref 0.0–0.4)
Eos: 2 %
Hematocrit: 43.4 % (ref 37.5–51.0)
Hemoglobin: 14.5 g/dL (ref 13.0–17.7)
Immature Grans (Abs): 0 10*3/uL (ref 0.0–0.1)
Immature Granulocytes: 0 %
Lymphocytes Absolute: 1.7 10*3/uL (ref 0.7–3.1)
Lymphs: 30 %
MCH: 32.4 pg (ref 26.6–33.0)
MCHC: 33.4 g/dL (ref 31.5–35.7)
MCV: 97 fL (ref 79–97)
Monocytes Absolute: 0.5 10*3/uL (ref 0.1–0.9)
Monocytes: 9 %
Neutrophils Absolute: 3.4 10*3/uL (ref 1.4–7.0)
Neutrophils: 58 %
Platelets: 233 10*3/uL (ref 150–450)
RBC: 4.48 x10E6/uL (ref 4.14–5.80)
RDW: 13 % (ref 11.6–15.4)
WBC: 5.8 10*3/uL (ref 3.4–10.8)

## 2021-08-16 LAB — LIPID PANEL
Chol/HDL Ratio: 4.1 ratio (ref 0.0–5.0)
Cholesterol, Total: 114 mg/dL (ref 100–199)
HDL: 28 mg/dL — ABNORMAL LOW (ref >39)
LDL Chol Calc (NIH): 60 mg/dL (ref 0–99)
Triglycerides: 152 mg/dL — ABNORMAL HIGH (ref 0–149)
VLDL Cholesterol Cal: 26 mg/dL (ref 5–40)

## 2021-08-16 LAB — T4, FREE: Free T4: 0.81 ng/dL — ABNORMAL LOW (ref 0.82–1.77)

## 2021-08-16 LAB — CARDIOVASCULAR RISK ASSESSMENT

## 2021-08-16 LAB — COMPREHENSIVE METABOLIC PANEL
ALT: 20 IU/L (ref 0–44)
AST: 26 IU/L (ref 0–40)
Albumin/Globulin Ratio: 1.8 (ref 1.2–2.2)
Albumin: 4.1 g/dL (ref 3.9–4.9)
Alkaline Phosphatase: 70 IU/L (ref 44–121)
BUN/Creatinine Ratio: 15 (ref 10–24)
BUN: 16 mg/dL (ref 8–27)
Bilirubin Total: 0.5 mg/dL (ref 0.0–1.2)
CO2: 24 mmol/L (ref 20–29)
Calcium: 9 mg/dL (ref 8.6–10.2)
Chloride: 104 mmol/L (ref 96–106)
Creatinine, Ser: 1.05 mg/dL (ref 0.76–1.27)
Globulin, Total: 2.3 g/dL (ref 1.5–4.5)
Glucose: 97 mg/dL (ref 70–99)
Potassium: 4.4 mmol/L (ref 3.5–5.2)
Sodium: 141 mmol/L (ref 134–144)
Total Protein: 6.4 g/dL (ref 6.0–8.5)
eGFR: 77 mL/min/{1.73_m2} (ref >59)

## 2021-08-16 LAB — PSA: Prostate Specific Ag, Serum: 0.3 ng/mL (ref 0.0–4.0)

## 2021-08-16 LAB — TSH: TSH: 8.56 u[IU]/mL — ABNORMAL HIGH (ref 0.450–4.500)

## 2021-08-18 ENCOUNTER — Other Ambulatory Visit: Payer: Self-pay | Admitting: Nurse Practitioner

## 2021-08-18 DIAGNOSIS — E039 Hypothyroidism, unspecified: Secondary | ICD-10-CM

## 2021-08-18 MED ORDER — SYNTHROID 88 MCG PO TABS: 88.0000 ug | ORAL_TABLET | Freq: Every day | ORAL | 1 refills | Status: DC

## 2021-08-19 ENCOUNTER — Other Ambulatory Visit: Payer: Self-pay

## 2021-08-19 DIAGNOSIS — E039 Hypothyroidism, unspecified: Secondary | ICD-10-CM

## 2021-08-22 ENCOUNTER — Ambulatory Visit: Payer: 59 | Admitting: Nurse Practitioner

## 2021-08-28 ENCOUNTER — Other Ambulatory Visit: Payer: Self-pay | Admitting: Nurse Practitioner

## 2021-09-15 ENCOUNTER — Other Ambulatory Visit: Payer: Self-pay | Admitting: Nurse Practitioner

## 2021-09-21 ENCOUNTER — Other Ambulatory Visit: Payer: Self-pay | Admitting: Nurse Practitioner

## 2021-09-27 ENCOUNTER — Encounter: Payer: Self-pay | Admitting: Nurse Practitioner

## 2021-09-30 ENCOUNTER — Other Ambulatory Visit: Payer: 59

## 2021-10-19 ENCOUNTER — Other Ambulatory Visit: Payer: Self-pay | Admitting: Nurse Practitioner

## 2021-10-19 DIAGNOSIS — E039 Hypothyroidism, unspecified: Secondary | ICD-10-CM

## 2021-10-19 MED ORDER — SYNTHROID 88 MCG PO TABS: 88.0000 ug | ORAL_TABLET | Freq: Every day | ORAL | 1 refills | Status: DC

## 2021-10-21 ENCOUNTER — Other Ambulatory Visit: Payer: Self-pay | Admitting: Nurse Practitioner

## 2021-10-21 DIAGNOSIS — E039 Hypothyroidism, unspecified: Secondary | ICD-10-CM

## 2021-10-21 MED ORDER — LEVOTHYROXINE SODIUM 88 MCG PO TABS: 88.0000 ug | ORAL_TABLET | Freq: Every day | ORAL | 3 refills | Status: DC

## 2021-10-22 ENCOUNTER — Other Ambulatory Visit: Payer: Self-pay

## 2021-10-22 DIAGNOSIS — E039 Hypothyroidism, unspecified: Secondary | ICD-10-CM

## 2021-10-22 MED ORDER — LEVOTHYROXINE SODIUM 88 MCG PO TABS: 88.0000 ug | ORAL_TABLET | Freq: Every day | ORAL | 3 refills | Status: DC

## 2021-10-31 ENCOUNTER — Other Ambulatory Visit: Payer: Self-pay | Admitting: Nurse Practitioner

## 2021-10-31 DIAGNOSIS — J3089 Other allergic rhinitis: Secondary | ICD-10-CM

## 2021-11-20 ENCOUNTER — Ambulatory Visit (INDEPENDENT_AMBULATORY_CARE_PROVIDER_SITE_OTHER): Payer: 59 | Admitting: Nurse Practitioner

## 2021-11-20 ENCOUNTER — Encounter: Payer: Self-pay | Admitting: Nurse Practitioner

## 2021-11-20 VITALS — BP 130/88 | HR 86 | Temp 96.9°F | Ht 70.0 in | Wt 299.4 lb

## 2021-11-20 DIAGNOSIS — R7303 Prediabetes: Secondary | ICD-10-CM

## 2021-11-20 DIAGNOSIS — I10 Essential (primary) hypertension: Secondary | ICD-10-CM

## 2021-11-20 DIAGNOSIS — Z7901 Long term (current) use of anticoagulants: Secondary | ICD-10-CM

## 2021-11-20 DIAGNOSIS — E039 Hypothyroidism, unspecified: Secondary | ICD-10-CM | POA: Diagnosis not present

## 2021-11-20 DIAGNOSIS — E782 Mixed hyperlipidemia: Secondary | ICD-10-CM

## 2021-11-20 DIAGNOSIS — K5790 Diverticulosis of intestine, part unspecified, without perforation or abscess without bleeding: Secondary | ICD-10-CM

## 2021-11-20 DIAGNOSIS — Z6841 Body Mass Index (BMI) 40.0 and over, adult: Secondary | ICD-10-CM

## 2021-11-20 MED ORDER — SEMAGLUTIDE-WEIGHT MANAGEMENT 2.4 MG/0.75ML ~~LOC~~ SOAJ: 2.4000 mg | SUBCUTANEOUS | 0 refills | Status: DC

## 2021-11-20 MED ORDER — SEMAGLUTIDE-WEIGHT MANAGEMENT 0.5 MG/0.5ML ~~LOC~~ SOAJ: 0.5000 mg | SUBCUTANEOUS | 0 refills | Status: DC

## 2021-11-20 MED ORDER — SEMAGLUTIDE-WEIGHT MANAGEMENT 1 MG/0.5ML ~~LOC~~ SOAJ: 1.0000 mg | SUBCUTANEOUS | 0 refills | Status: DC

## 2021-11-20 MED ORDER — SEMAGLUTIDE-WEIGHT MANAGEMENT 0.25 MG/0.5ML ~~LOC~~ SOAJ: 0.2500 mg | SUBCUTANEOUS | 0 refills | Status: AC

## 2021-11-20 MED ORDER — SEMAGLUTIDE-WEIGHT MANAGEMENT 1.7 MG/0.75ML ~~LOC~~ SOAJ: 1.7000 mg | SUBCUTANEOUS | 0 refills | Status: DC

## 2021-11-20 NOTE — Assessment & Plan Note (Signed)
°  No changes to medicines.  °Continue to work on eating a healthy diet and exercise.  °Labs drawn today.  ° °

## 2021-11-20 NOTE — Assessment & Plan Note (Signed)
No changes to medicines.   Labs drawn today.
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
## 2021-11-20 NOTE — Patient Instructions (Addendum)
We will call you with lab results Continue medications Follow-up in 76-month, fasting   Diverticulosis  Diverticulosis is a condition that develops when small pouches (diverticula) form in the wall of the large intestine (colon). The colon is where water is absorbed and stool (feces) is formed. The pouches form when the inside layer of the colon pushes through weak spots in the outer layers of the colon. You may have a few pouches or many of them. The pouches usually do not cause problems unless they become inflamed or infected. When this happens, the condition is called diverticulitis. What are the causes? The cause of this condition is not known. What increases the risk? The following factors may make you more likely to develop this condition: Being older than age 69 Your risk for this condition increases with age. Diverticulosis is rare among people younger than age 69 By age 69 many people have it. Eating a low-fiber diet. Having frequent constipation. Being overweight. Not getting enough exercise. Smoking. Taking over-the-counter pain medicines, like aspirin and ibuprofen. Having a family history of diverticulosis. What are the signs or symptoms? In most people, there are no symptoms of this condition. If you do have symptoms, they may include: Bloating. Cramps in the abdomen. Constipation or diarrhea. Pain in the lower left side of the abdomen. How is this diagnosed? Because diverticulosis usually has no symptoms, it is most often diagnosed during an exam for other colon problems. The condition may be diagnosed by: Using a flexible scope to examine the colon (colonoscopy). Taking an X-ray of the colon after dye has been put into the colon (barium enema). Having a CT scan. How is this treated? You may not need treatment for this condition. Your health care provider may recommend treatment to prevent problems. You may need treatment if you have symptoms or if you previously  had diverticulitis. Treatment may include: Eating a high-fiber diet. Taking a fiber supplement. Taking a live bacteria supplement (probiotic). Taking medicine to relax your colon. Follow these instructions at home: Medicines Take over-the-counter and prescription medicines only as told by your health care provider. If told by your health care provider, take a fiber supplement or probiotic. Constipation prevention Your condition may cause constipation. To prevent or treat constipation, you may need to: Drink enough fluid to keep your urine pale yellow. Take over-the-counter or prescription medicines. Eat foods that are high in fiber, such as beans, whole grains, and fresh fruits and vegetables. Limit foods that are high in fat and processed sugars, such as fried or sweet foods.  General instructions Try not to strain when you have a bowel movement. Keep all follow-up visits as told by your health care provider. This is important. Contact a health care provider if you: Have pain in your abdomen. Have bloating. Have cramps. Have not had a bowel movement in 3 days. Get help right away if: Your pain gets worse. Your bloating becomes very bad. You have a fever or chills, and your symptoms suddenly get worse. You vomit. You have bowel movements that are bloody or black. You have bleeding from your rectum. Summary Diverticulosis is a condition that develops when small pouches (diverticula) form in the wall of the large intestine (colon). You may have a few pouches or many of them. This condition is most often diagnosed during an exam for other colon problems. Treatment may include increasing the fiber in your diet, taking supplements, or taking medicines. This information is not intended to replace advice given to  you by your health care provider. Make sure you discuss any questions you have with your health care provider. Document Revised: 07/21/2018 Document Reviewed:  07/21/2018 Elsevier Patient Education  [place].

## 2021-11-21 LAB — COMPREHENSIVE METABOLIC PANEL
ALT: 20 IU/L (ref 0–44)
AST: 26 IU/L (ref 0–40)
Albumin/Globulin Ratio: 1.5 (ref 1.2–2.2)
Albumin: 3.7 g/dL — ABNORMAL LOW (ref 3.9–4.9)
Alkaline Phosphatase: 67 IU/L (ref 44–121)
BUN/Creatinine Ratio: 12 (ref 10–24)
BUN: 16 mg/dL (ref 8–27)
Bilirubin Total: 0.5 mg/dL (ref 0.0–1.2)
CO2: 25 mmol/L (ref 20–29)
Calcium: 9.1 mg/dL (ref 8.6–10.2)
Chloride: 105 mmol/L (ref 96–106)
Creatinine, Ser: 1.3 mg/dL — ABNORMAL HIGH (ref 0.76–1.27)
Globulin, Total: 2.5 g/dL (ref 1.5–4.5)
Glucose: 98 mg/dL (ref 70–99)
Potassium: 4.4 mmol/L (ref 3.5–5.2)
Sodium: 144 mmol/L (ref 134–144)
Total Protein: 6.2 g/dL (ref 6.0–8.5)
eGFR: 59 mL/min/{1.73_m2} — ABNORMAL LOW (ref >59)

## 2021-11-21 LAB — LIPID PANEL
Chol/HDL Ratio: 3.7 ratio (ref 0.0–5.0)
Cholesterol, Total: 117 mg/dL (ref 100–199)
HDL: 32 mg/dL — ABNORMAL LOW (ref >39)
LDL Chol Calc (NIH): 62 mg/dL (ref 0–99)
Triglycerides: 126 mg/dL (ref 0–149)
VLDL Cholesterol Cal: 23 mg/dL (ref 5–40)

## 2021-11-21 LAB — CBC WITH DIFFERENTIAL/PLATELET
Basophils Absolute: 0.1 10*3/uL (ref 0.0–0.2)
Basos: 1 %
EOS (ABSOLUTE): 0.1 10*3/uL (ref 0.0–0.4)
Eos: 2 %
Hematocrit: 40.9 % (ref 37.5–51.0)
Hemoglobin: 14.1 g/dL (ref 13.0–17.7)
Immature Grans (Abs): 0 10*3/uL (ref 0.0–0.1)
Immature Granulocytes: 0 %
Lymphocytes Absolute: 1.8 10*3/uL (ref 0.7–3.1)
Lymphs: 33 %
MCH: 32.7 pg (ref 26.6–33.0)
MCHC: 34.5 g/dL (ref 31.5–35.7)
MCV: 95 fL (ref 79–97)
Monocytes Absolute: 0.5 10*3/uL (ref 0.1–0.9)
Monocytes: 9 %
Neutrophils Absolute: 2.9 10*3/uL (ref 1.4–7.0)
Neutrophils: 55 %
Platelets: 235 10*3/uL (ref 150–450)
RBC: 4.31 x10E6/uL (ref 4.14–5.80)
RDW: 12 % (ref 11.6–15.4)
WBC: 5.4 10*3/uL (ref 3.4–10.8)

## 2021-11-21 LAB — HEMOGLOBIN A1C
Est. average glucose Bld gHb Est-mCnc: 126 mg/dL
Hgb A1c MFr Bld: 6 % — ABNORMAL HIGH (ref 4.8–5.6)

## 2021-11-21 LAB — T4, FREE: Free T4: 0.94 ng/dL (ref 0.82–1.77)

## 2021-11-21 LAB — TSH: TSH: 9.54 u[IU]/mL — ABNORMAL HIGH (ref 0.450–4.500)

## 2021-11-21 LAB — CARDIOVASCULAR RISK ASSESSMENT

## 2021-12-01 ENCOUNTER — Other Ambulatory Visit: Payer: Self-pay

## 2021-12-01 DIAGNOSIS — E039 Hypothyroidism, unspecified: Secondary | ICD-10-CM

## 2021-12-01 MED ORDER — LEVOTHYROXINE SODIUM 100 MCG PO TABS: 100.0000 ug | ORAL_TABLET | Freq: Every day | ORAL | 0 refills | Status: DC

## 2021-12-01 MED ORDER — ROSUVASTATIN CALCIUM 20 MG PO TABS: ORAL_TABLET | ORAL | 2 refills | Status: DC

## 2021-12-03 ENCOUNTER — Other Ambulatory Visit: Payer: Self-pay | Admitting: Nurse Practitioner

## 2021-12-15 ENCOUNTER — Telehealth: Payer: Self-pay

## 2021-12-15 NOTE — Telephone Encounter (Signed)
I tried to contact the patient today to get scheduled for his next 3 month f.up fasting with Palma Holter, NP from 11/20/2021. I left a message for the patient to call the office back to get the appointment scheduled.

## 2021-12-18 ENCOUNTER — Other Ambulatory Visit: Payer: Self-pay | Admitting: Nurse Practitioner

## 2021-12-24 ENCOUNTER — Other Ambulatory Visit: Payer: Self-pay | Admitting: Nurse Practitioner

## 2021-12-24 DIAGNOSIS — E039 Hypothyroidism, unspecified: Secondary | ICD-10-CM

## 2022-01-15 ENCOUNTER — Encounter: Payer: Self-pay | Admitting: Nurse Practitioner

## 2022-01-15 NOTE — Telephone Encounter (Signed)
Appt scheduled for tomorrow morning.
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
## 2022-01-16 ENCOUNTER — Ambulatory Visit: Payer: 59 | Admitting: Nurse Practitioner

## 2022-01-16 ENCOUNTER — Encounter: Payer: Self-pay | Admitting: Nurse Practitioner

## 2022-01-16 VITALS — BP 128/78 | HR 87 | Temp 97.7°F | Ht 70.0 in | Wt 303.0 lb

## 2022-01-16 DIAGNOSIS — J209 Acute bronchitis, unspecified: Secondary | ICD-10-CM | POA: Diagnosis not present

## 2022-01-16 DIAGNOSIS — R0989 Other specified symptoms and signs involving the circulatory and respiratory systems: Secondary | ICD-10-CM

## 2022-01-16 HISTORY — DX: Other specified symptoms and signs involving the circulatory and respiratory systems: R09.89

## 2022-01-16 MED ORDER — FLUTICASONE PROPIONATE 50 MCG/ACT NA SUSP: 2.0000 | Freq: Every day | NASAL | 6 refills | Status: DC

## 2022-01-16 MED ORDER — PREDNISONE 10 MG PO TABS: ORAL_TABLET | ORAL | 0 refills | Status: AC

## 2022-01-16 MED ORDER — AZITHROMYCIN 250 MG PO TABS: ORAL_TABLET | ORAL | 0 refills | Status: AC

## 2022-01-16 MED ORDER — TRIAMCINOLONE ACETONIDE 40 MG/ML IJ SUSP
40.0000 mg | Freq: Once | INTRAMUSCULAR | Status: AC
  Administered 2022-01-16: 40 mg via INTRAMUSCULAR

## 2022-01-16 MED ORDER — PROMETHAZINE-DM 6.25-15 MG/5ML PO SYRP: 5.0000 mL | ORAL_SOLUTION | Freq: Four times a day (QID) | ORAL | 0 refills | Status: DC | PRN

## 2022-01-16 NOTE — Assessment & Plan Note (Signed)
Take antibiotics as prescribed Use Flonase nasal spray daily Take Promethazine-DM up to 4 times daily for cough/sinus congestion Drink plenty of fluids Follow-up as needed  

## 2022-01-16 NOTE — Patient Instructions (Addendum)
Take antibiotics as prescribed Use Flonase nasal spray daily Take Promethazine-DM up to 4 times daily for cough/sinus congestion Drink plenty of fluids Follow-up as needed   Acute Bronchitis, Adult  Acute bronchitis is when air tubes in the lungs (bronchi) suddenly get swollen. The condition can make it hard for you to breathe. In adults, acute bronchitis usually goes away within 2 weeks. A cough caused by bronchitis may last up to 3 weeks. Smoking, allergies, and asthma can make the condition worse. What are the causes? Germs that cause cold and flu (viruses). The most common cause of this condition is the virus that causes the common cold. Bacteria. Substances that bother (irritate) the lungs, including: Smoke from cigarettes and other types of tobacco. Dust and pollen. Fumes from chemicals, gases, or burned fuel. Indoor or outdoor air pollution. What increases the risk? A weak body's defense system. This is also called the immune system. Any condition that affects your lungs and breathing, such as asthma. What are the signs or symptoms? A cough. Coughing up clear, yellow, or green mucus. Making high-pitched whistling sounds when you breathe, most often when you breathe out (wheezing). Runny or stuffy nose. Having too much mucus in your lungs (chest congestion). Shortness of breath. Body aches. A sore throat. How is this treated? Acute bronchitis may go away over time without treatment. Your doctor may tell you to: Drink more fluids. This will help thin your mucus so it is easier to cough up. Use a device that gets medicine into your lungs (inhaler). Use a vaporizer or a humidifier. These are machines that add water to the air. This helps with coughing and poor breathing. Take a medicine that thins mucus and helps clear it from your lungs. Take a medicine that prevents or stops coughing. It is not common to take an antibiotic medicine for this condition. Follow these  instructions at home:  Take over-the-counter and prescription medicines only as told by your doctor. Use an inhaler, vaporizer, or humidifier as told by your doctor. Take two teaspoons (10 mL) of honey at bedtime. This helps lessen your coughing at night. Drink enough fluid to keep your pee (urine) pale yellow. Do not smoke or use any products that contain nicotine or tobacco. If you need help quitting, ask your doctor. Get a lot of rest. Return to your normal activities when your doctor says that it is safe. Keep all follow-up visits. How is this prevented?  Wash your hands often with soap and water for at least 20 seconds. If you cannot use soap and water, use hand sanitizer. Avoid contact with people who have cold symptoms. Try not to touch your mouth, nose, or eyes with your hands. Avoid breathing in smoke or chemical fumes. Make sure to get the flu shot every year. Contact a doctor if: Your symptoms do not get better in 2 weeks. You have trouble coughing up the mucus. Your cough keeps you awake at night. You have a fever. Get help right away if: You cough up blood. You have chest pain. You have very bad shortness of breath. You faint or keep feeling like you are going to faint. You have a very bad headache. Your fever or chills get worse. These symptoms may be an emergency. Get help right away. Call your local emergency services (911 in the [place] Do not wait to see if the symptoms will go away. Do not drive yourself to the hospital. Summary Acute bronchitis is when air tubes in the  lungs (bronchi) suddenly get swollen. In adults, acute bronchitis usually goes away within 2 weeks. Drink more fluids. This will help thin your mucus so it is easier to cough up. Take over-the-counter and prescription medicines only as told by your doctor. Contact a doctor if your symptoms do not improve after 2 weeks of treatment. This information is not intended to replace advice given to you  by your health care provider. Make sure you discuss any questions you have with your health care provider. Document Revised: 04/24/2020 Document Reviewed: 04/24/2020 Elsevier Patient Education  [place].

## 2022-01-16 NOTE — Assessment & Plan Note (Signed)
Take antibiotics and steroids as prescribed Use Flonase nasal spray daily Take Promethazine-DM up to 4 times daily for cough/sinus congestion Drink plenty of fluids Follow-up as needed

## 2022-01-18 ENCOUNTER — Other Ambulatory Visit: Payer: Self-pay | Admitting: Nurse Practitioner

## 2022-01-25 ENCOUNTER — Encounter: Payer: Self-pay | Admitting: Nurse Practitioner

## 2022-01-26 ENCOUNTER — Other Ambulatory Visit: Payer: Self-pay | Admitting: Nurse Practitioner

## 2022-01-26 ENCOUNTER — Other Ambulatory Visit: Payer: 59

## 2022-01-26 DIAGNOSIS — E039 Hypothyroidism, unspecified: Secondary | ICD-10-CM

## 2022-01-26 DIAGNOSIS — J209 Acute bronchitis, unspecified: Secondary | ICD-10-CM

## 2022-01-26 NOTE — Telephone Encounter (Signed)
Spoke with patient this morning, x-ray ordered was given to patient.

## 2022-01-27 LAB — TSH: TSH: 12.1 u[IU]/mL — ABNORMAL HIGH (ref 0.450–4.500)

## 2022-01-28 ENCOUNTER — Telehealth: Payer: Self-pay

## 2022-01-28 ENCOUNTER — Other Ambulatory Visit: Payer: Self-pay | Admitting: Nurse Practitioner

## 2022-01-28 DIAGNOSIS — E039 Hypothyroidism, unspecified: Secondary | ICD-10-CM

## 2022-01-28 MED ORDER — LEVOTHYROXINE SODIUM 112 MCG PO TABS: 112.0000 ug | ORAL_TABLET | Freq: Every day | ORAL | 3 refills | Status: DC

## 2022-01-28 NOTE — Telephone Encounter (Signed)
Patient is concerned about his Thyroid going up.  He states that he is taking 163mgof Levothyroxine every day.  Is there something else he can do or take?

## 2022-01-30 ENCOUNTER — Other Ambulatory Visit: Payer: Self-pay | Admitting: Nurse Practitioner

## 2022-01-30 DIAGNOSIS — E039 Hypothyroidism, unspecified: Secondary | ICD-10-CM

## 2022-02-02 ENCOUNTER — Encounter: Payer: Self-pay | Admitting: Nurse Practitioner

## 2022-03-11 ENCOUNTER — Other Ambulatory Visit: Payer: Self-pay

## 2022-03-11 DIAGNOSIS — E039 Hypothyroidism, unspecified: Secondary | ICD-10-CM

## 2022-03-12 ENCOUNTER — Other Ambulatory Visit: Payer: 59

## 2022-03-12 DIAGNOSIS — E039 Hypothyroidism, unspecified: Secondary | ICD-10-CM

## 2022-03-13 LAB — T4, FREE: Free T4: 1.13 ng/dL (ref 0.82–1.77)

## 2022-03-13 LAB — TSH: TSH: 2.92 u[IU]/mL (ref 0.450–4.500)

## 2022-03-18 ENCOUNTER — Other Ambulatory Visit: Payer: Self-pay

## 2022-03-18 MED ORDER — METFORMIN HCL 500 MG PO TABS: 500.0000 mg | ORAL_TABLET | Freq: Two times a day (BID) | ORAL | 1 refills | Status: DC

## 2022-05-03 ENCOUNTER — Encounter: Payer: Self-pay | Admitting: Physician Assistant

## 2022-05-05 ENCOUNTER — Ambulatory Visit: Payer: Self-pay | Admitting: Physician Assistant

## 2022-05-24 ENCOUNTER — Other Ambulatory Visit: Payer: Self-pay | Admitting: Physician Assistant

## 2022-05-25 ENCOUNTER — Other Ambulatory Visit: Payer: Self-pay

## 2022-05-25 DIAGNOSIS — J3089 Other allergic rhinitis: Secondary | ICD-10-CM

## 2022-05-25 MED ORDER — LEVOCETIRIZINE DIHYDROCHLORIDE 5 MG PO TABS: ORAL_TABLET | ORAL | 5 refills | Status: DC

## 2022-06-02 ENCOUNTER — Telehealth: Payer: Self-pay

## 2022-06-02 ENCOUNTER — Other Ambulatory Visit: Payer: Self-pay

## 2022-06-02 NOTE — Telephone Encounter (Signed)
Patient called stating that he tried calling Friday afternoon and forgot that we were already closed for the day and was trying to get a refill on his Eliquis and states that they were supposed to send over refill request and they did not. Now patient is completely out of medication and is in [place], [place] and states that he does not think there is a [facility] in [place] and is wanting to know what he needs to do because he is going to be there for 3 weeks on a job. Please advise.

## 2022-06-02 NOTE — Telephone Encounter (Signed)
This pt is overdue for fasting chronic follow up --- he has appt scheduled in September but was supposed to come in March Needs 40 min appt --- please schedule and will then fill medication

## 2022-06-02 NOTE — Telephone Encounter (Signed)
Left patient a voicemail for patient to call office and reschedule appointment.

## 2022-06-02 NOTE — Telephone Encounter (Signed)
Called patient left voicemail for patient to call office back.

## 2022-06-02 NOTE — Telephone Encounter (Signed)
PATIENT INFORMED, PATIENT WILL CHECK WITH A LOCAL [facility] AND GIVE [place] A CALL BACK ON WHAT THEY SAID TO DO.

## 2022-06-03 MED ORDER — APIXABAN 5 MG PO TABS: 5.0000 mg | ORAL_TABLET | Freq: Two times a day (BID) | ORAL | 0 refills | Status: DC

## 2022-06-10 ENCOUNTER — Other Ambulatory Visit: Payer: Self-pay

## 2022-06-10 MED ORDER — ROSUVASTATIN CALCIUM 20 MG PO TABS: ORAL_TABLET | ORAL | 1 refills | Status: DC

## 2022-06-23 ENCOUNTER — Telehealth: Payer: Self-pay

## 2022-06-23 NOTE — Telephone Encounter (Signed)
Patient called back and appointment was moved to 07/30/22 for fasting appointment. Patient also had concern about some congestion, sinus pressure. That he has had going on for a few days and has been taking OTC tylenol and mucinex but stated it does not seem to be helping him.  Recommend patient do a virtual urgent care visit on my chart or go to the nearest urgent care due to office not having any open appointments.

## 2022-06-24 ENCOUNTER — Other Ambulatory Visit: Payer: Self-pay

## 2022-06-24 MED ORDER — OLMESARTAN MEDOXOMIL 40 MG PO TABS: ORAL_TABLET | ORAL | 1 refills | Status: DC

## 2022-07-08 ENCOUNTER — Other Ambulatory Visit: Payer: Self-pay | Admitting: Physician Assistant

## 2022-07-30 ENCOUNTER — Ambulatory Visit: Payer: 59 | Admitting: Physician Assistant

## 2022-07-30 ENCOUNTER — Encounter: Payer: Self-pay | Admitting: Physician Assistant

## 2022-07-30 VITALS — BP 112/70 | HR 77 | Temp 97.3°F | Ht 70.0 in | Wt 295.8 lb

## 2022-07-30 DIAGNOSIS — Z125 Encounter for screening for malignant neoplasm of prostate: Secondary | ICD-10-CM

## 2022-07-30 DIAGNOSIS — Z1159 Encounter for screening for other viral diseases: Secondary | ICD-10-CM

## 2022-07-30 DIAGNOSIS — R0981 Nasal congestion: Secondary | ICD-10-CM | POA: Insufficient documentation

## 2022-07-30 DIAGNOSIS — K219 Gastro-esophageal reflux disease without esophagitis: Secondary | ICD-10-CM | POA: Insufficient documentation

## 2022-07-30 DIAGNOSIS — E039 Hypothyroidism, unspecified: Secondary | ICD-10-CM

## 2022-07-30 DIAGNOSIS — R7303 Prediabetes: Secondary | ICD-10-CM

## 2022-07-30 DIAGNOSIS — Z7901 Long term (current) use of anticoagulants: Secondary | ICD-10-CM | POA: Insufficient documentation

## 2022-07-30 DIAGNOSIS — I10 Essential (primary) hypertension: Secondary | ICD-10-CM

## 2022-07-30 DIAGNOSIS — E782 Mixed hyperlipidemia: Secondary | ICD-10-CM

## 2022-07-30 HISTORY — DX: Nasal congestion: R09.81

## 2022-07-30 HISTORY — DX: Prediabetes: R73.03

## 2022-07-30 HISTORY — DX: Encounter for screening for other viral diseases: Z11.59

## 2022-07-30 HISTORY — DX: Long term (current) use of anticoagulants: Z79.01

## 2022-07-30 HISTORY — DX: Gastro-esophageal reflux disease without esophagitis: K21.9

## 2022-07-30 HISTORY — DX: Encounter for screening for malignant neoplasm of prostate: Z12.5

## 2022-07-30 MED ORDER — BUDESONIDE 32 MCG/ACT NA SUSP
1.0000 | Freq: Every day | NASAL | Status: AC
Start: 2022-07-30 — End: ?

## 2022-07-30 NOTE — Progress Notes (Signed)
Subjective:  Patient ID: Cody Romero, male    DOB: December 21, 1950  Age: 72 y.o. MRN: [id number]  Chief Complaint  Patient presents with   Medical Management of Chronic Issues  PT NEW TO ME _ [facility]  HPI Pt presents for follow up of hypertension. The patient is tolerating the medication well without side effects. Compliance with treatment has been good; including taking medication as directed , maintains a healthy diet , and following up as directed. Currently taking benicar 40mg  every day Denies chest pain/sob/edema  Mixed hyperlipidemia  Pt presents with hyperlipidemia.  Compliance with treatment has been good -The patient is compliant with medications, maintains a low cholesterol diet , follows up as directed , and maintains an exercise regimen . The patient denies experiencing any hypercholesterolemia related symptoms. Currently taking crestor 20mg  every day  Pt with GERD - stable on prilosec 20mg  every day  Pt with prediabetes - has been taking glucophage 500mg   Pt with hypothyroidism taking synthroid - due for labwork  Pt is currently on eliquis - he had a DVT in leg in 1999 and then a few years ago with PE  Pt has history of melanoma on his back - had surgical resection about 5 years ago - he does not follow with dermatology regularly - states they call him to come in for appts but he is not interested at this time  Pt is due for PSA - history states he has had BPH but pt denies any symptoms  Recommend Hep C screening - he did have blood transfusion as a child       07/30/2022   10:08 AM 11/20/2021    7:36 AM 08/15/2021    7:31 AM 10/30/2020    8:30 AM 10/30/2020    8:13 AM  Depression screen PHQ 2/9  Decreased Interest 1 0 0 3 0  Down, Depressed, Hopeless 1 0 0 0 0  PHQ - 2 Score 2 0 0 3 0  Altered sleeping 0 0 0 3   Tired, decreased energy 1 0 3 3   Change in appetite 1 0 0 0   Feeling bad or failure about yourself  0 0 0 0   Trouble concentrating 0 0 0  1   Moving slowly or fidgety/restless 0 0 0 0   Suicidal thoughts 0 0 0 0   PHQ-9 Score 4 0 3 10   Difficult doing work/chores Somewhat difficult Not difficult at all Not difficult at all Not difficult at all         10/09/2020    8:14 AM 10/23/2020    3:50 PM 10/30/2020    8:13 AM 11/20/2021    7:35 AM 07/30/2022   10:08 AM  Fall Risk  Falls in the past year? 0 0 0 0 0  Was there an injury with Fall? 0 0 0 0 0  Fall Risk Category Calculator 0 0 0 0 0  Fall Risk Category (Retired) Low Low Low Low   (RETIRED) Patient Fall Risk Level Low fall risk Low fall risk Low fall risk Low fall risk   Patient at Risk for Falls Due to No Fall Risks No Fall Risks No Fall Risks No Fall Risks No Fall Risks  Fall risk Follow up Falls evaluation completed Falls evaluation completed Falls evaluation completed Falls evaluation completed Falls evaluation completed     ROS CONSTITUTIONAL: Negative for chills, fatigue, fever, unintentional weight gain and unintentional weight loss.  E/N/T: Negative for ear  pain, nasal congestion and sore throat.  CARDIOVASCULAR: Negative for chest pain, dizziness, palpitations and pedal edema.  RESPIRATORY: Negative for recent cough and dyspnea.  GASTROINTESTINAL: Negative for abdominal pain, acid reflux symptoms, constipation, diarrhea, nausea and vomiting.  MSK: Negative for arthralgias and myalgias.  INTEGUMENTARY: Negative for rash.  NEUROLOGICAL: Negative for dizziness and headaches.  PSYCHIATRIC: Negative for sleep disturbance and to question depression screen.  Negative for depression, negative for anhedonia.    Current Outpatient Medications:    apixaban (ELIQUIS) 5 MG TABS tablet, Take 1 tablet (5 mg total) by mouth 2 (two) times daily., Disp: 60 tablet, Rfl: 0   Ascorbic Acid (VITAMIN C) 1000 MG tablet, Take 1,000 mg by mouth daily., Disp: , Rfl:    Cholecalciferol (VITAMIN D3 PO), Take 1 tablet by mouth at bedtime. , Disp: , Rfl:    Cyanocobalamin (B-12 PO),  Take 1 tablet by mouth at bedtime. , Disp: , Rfl:    levocetirizine (XYZAL) 5 MG tablet, TAKE 1 TABLET BY MOUTH EVERY DAY IN THE EVENING, Disp: 30 tablet, Rfl: 5   levothyroxine (SYNTHROID) 112 MCG tablet, Take 1 tablet (112 mcg total) by mouth daily., Disp: 90 tablet, Rfl: 3   Magnesium 250 MG TABS, Take 250 mg by mouth daily., Disp: , Rfl:    metFORMIN (GLUCOPHAGE) 500 MG tablet, TAKE 1 TABLET BY MOUTH 2 TIMES DAILY WITH A MEAL., Disp: 180 tablet, Rfl: 1   Misc Natural Products (BEET ROOT PO), Take by mouth., Disp: , Rfl:    Multiple Vitamin (MULTIVITAMIN WITH MINERALS) TABS tablet, Take 1 tablet by mouth at bedtime., Disp: , Rfl:    olmesartan (BENICAR) 40 MG tablet, TAKE 1 TABLET BY MOUTH EVERYDAY AT BEDTIME, Disp: 90 tablet, Rfl: 0   omeprazole (PRILOSEC) 20 MG capsule, TAKE 1 CAPSULE BY MOUTH EVERY DAY, Disp: 90 capsule, Rfl: 1   rosuvastatin (CRESTOR) 20 MG tablet, TAKE 1 TABLET BY MOUTH DAILY AT 6 PM., Disp: 30 tablet, Rfl: 1   Zinc 50 MG CAPS, Take 50 mg by mouth daily., Disp: , Rfl:    budesonide (RHINOCORT ALLERGY) 32 MCG/ACT nasal spray, Place 1 spray into both nostrils daily., Disp: , Rfl:   Past Medical History:  Diagnosis Date   BPH (benign prostatic hypertrophy)    Cancer (HCC)    Colon polyps    DVT (deep venous thrombosis) (HCC) 1999   6 months of coumadin   Gout    unconfirmed diagnosis   History of kidney stones    Hyperlipidemia    Hypertension    Melanoma in situ (HCC)    Back   Objective:  PHYSICAL EXAM:   BP 112/70 (BP Location: Left Arm, Patient Position: Sitting, Cuff Size: Large)   Pulse 77   Temp (!) 97.3 F (36.3 C) (Temporal)   Ht 5\' 10"  (1.778 m)   Wt 295 lb 12.8 oz (134.2 kg)   SpO2 95%   BMI 42.44 kg/m    GEN: Well nourished, well developed, in no acute distress  Cardiac: RRR; no murmurs, rubs, or gallops,no edema -  Respiratory:  normal respiratory rate and pattern with no distress - normal breath sounds with no rales, rhonchi, wheezes or  rubs GI: normal bowel sounds, no masses or tenderness Skin: warm and dry, no rash - back shows surgical scar of melanoma with no abnormality  Neuro:  Alert and Oriented x 3, - CN II-Xii grossly intact Psych: euthymic mood, appropriate affect and demeanor  Assessment & Plan:  Acquired hypothyroidism -     TSH Continue med Primary hypertension -     CBC with Differential/Platelet -     Comprehensive metabolic panel Continue med Mixed hyperlipidemia -     Lipid panel Continue med Watch diet Prediabetes -     Hemoglobin A1c Watch diet Need for hepatitis C screening test -     Hepatitis C antibody  Prostate cancer screening -     PSA  Nasal congestion -     Budesonide; Place 1 spray into both nostrils daily.  Long term current use of anticoagulant Continue eliquis Gastroesophageal reflux disease without esophagitis Continue prilosec    Follow-up: Return in about 6 months (around 01/30/2023) for fasting physical - 20 min.  An After Visit Summary was printed and given to the patient.  Jettie Pagan [facility] [phone]

## 2022-07-31 LAB — CBC WITH DIFFERENTIAL/PLATELET: Hemoglobin: 14.3 g/dL (ref 13.0–17.7)

## 2022-07-31 LAB — COMPREHENSIVE METABOLIC PANEL: Sodium: 140 mmol/L (ref 134–144)

## 2022-08-16 ENCOUNTER — Other Ambulatory Visit: Payer: Self-pay | Admitting: Physician Assistant

## 2022-09-13 ENCOUNTER — Other Ambulatory Visit: Payer: Self-pay | Admitting: Physician Assistant

## 2022-09-16 ENCOUNTER — Ambulatory Visit: Payer: Self-pay | Admitting: Physician Assistant

## 2022-10-09 ENCOUNTER — Other Ambulatory Visit: Payer: Self-pay | Admitting: Physician Assistant

## 2022-11-02 ENCOUNTER — Ambulatory Visit (INDEPENDENT_AMBULATORY_CARE_PROVIDER_SITE_OTHER): Payer: 59

## 2022-11-02 VITALS — BP 122/72 | HR 83 | Temp 97.9°F | Resp 16 | Ht 70.0 in | Wt 300.0 lb

## 2022-11-02 DIAGNOSIS — H9201 Otalgia, right ear: Secondary | ICD-10-CM

## 2022-11-02 DIAGNOSIS — H6123 Impacted cerumen, bilateral: Secondary | ICD-10-CM | POA: Insufficient documentation

## 2022-11-02 HISTORY — DX: Otalgia, right ear: H92.01

## 2022-11-02 HISTORY — DX: Impacted cerumen, bilateral: H61.23

## 2022-11-02 MED ORDER — AMOXICILLIN 500 MG PO CAPS: 500.0000 mg | ORAL_CAPSULE | Freq: Three times a day (TID) | ORAL | 0 refills | Status: AC
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
## 2022-11-02 NOTE — Patient Instructions (Signed)
Not sure if you have ear infection, but since the ear does not look normal on exam and you had the pain for a week, will send antibiotic to your pharmacy Please use DEBROX EAR DROPS couple times a month to clear the wax in your ears Watch your Bps at home Let us know if you do not feel any better.

## 2022-11-02 NOTE — Assessment & Plan Note (Signed)
Cody Romero is here with complaints of right ear pain which has been present for at least a week Reports pain on the right side of his face and neck.  Since there was impacted cerumen which made it difficult to visualize his Tms bilaterally, I had the MA do bilateral ear irrigation. After irrigation, I still could not visualize the Tms bilaterally.  PLAN: since he has worsening pain in his ears bilaterally and possible infection on right, will treat him with oral antibiotic.  - sent a prescription for Amoxicillin 500 mg TID for 7 days to his pharmacy -advised to take tylenol as needed for any pain or discomfort - advised to use DEBROX ear drops OTC as recommended on the bottle to clear up the cerumen. - Report back if any worsening symptoms or call 911 for any severe symptoms

## 2022-11-06 ENCOUNTER — Other Ambulatory Visit: Payer: Self-pay | Admitting: Physician Assistant

## 2022-12-16 ENCOUNTER — Other Ambulatory Visit: Payer: Self-pay | Admitting: Family Medicine

## 2022-12-20 ENCOUNTER — Other Ambulatory Visit: Payer: Self-pay | Admitting: Physician Assistant

## 2023-01-05 ENCOUNTER — Other Ambulatory Visit: Payer: Self-pay | Admitting: Family Medicine

## 2023-01-11 ENCOUNTER — Other Ambulatory Visit: Payer: Self-pay | Admitting: Physician Assistant

## 2023-01-15 ENCOUNTER — Other Ambulatory Visit: Payer: Self-pay

## 2023-01-15 DIAGNOSIS — E039 Hypothyroidism, unspecified: Secondary | ICD-10-CM

## 2023-01-15 MED ORDER — LEVOTHYROXINE SODIUM 112 MCG PO TABS
112.0000 ug | ORAL_TABLET | Freq: Every day | ORAL | 0 refills | Status: DC
Start: 2023-01-15 — End: 2023-03-09

## 2023-02-05 ENCOUNTER — Ambulatory Visit (INDEPENDENT_AMBULATORY_CARE_PROVIDER_SITE_OTHER): Payer: 59 | Admitting: Physician Assistant

## 2023-02-05 ENCOUNTER — Encounter: Payer: Self-pay | Admitting: Physician Assistant

## 2023-02-05 ENCOUNTER — Ambulatory Visit (INDEPENDENT_AMBULATORY_CARE_PROVIDER_SITE_OTHER)
Admission: RE | Admit: 2023-02-05 | Discharge: 2023-02-05 | Disposition: A | Discharge status: Home / Self Care | Payer: 59 | Source: Ambulatory Visit | Attending: Physician Assistant | Admitting: Physician Assistant

## 2023-02-05 VITALS — BP 132/80 | HR 68 | Temp 98.0°F | Resp 14 | Ht 70.0 in | Wt 311.8 lb

## 2023-02-05 DIAGNOSIS — R053 Chronic cough: Secondary | ICD-10-CM

## 2023-02-05 DIAGNOSIS — R600 Localized edema: Secondary | ICD-10-CM

## 2023-02-05 DIAGNOSIS — E039 Hypothyroidism, unspecified: Secondary | ICD-10-CM | POA: Diagnosis not present

## 2023-02-05 DIAGNOSIS — N3001 Acute cystitis with hematuria: Secondary | ICD-10-CM

## 2023-02-05 DIAGNOSIS — Z87898 Personal history of other specified conditions: Secondary | ICD-10-CM

## 2023-02-05 DIAGNOSIS — Z0000 Encounter for general adult medical examination without abnormal findings: Secondary | ICD-10-CM

## 2023-02-05 DIAGNOSIS — E782 Mixed hyperlipidemia: Secondary | ICD-10-CM | POA: Diagnosis not present

## 2023-02-05 DIAGNOSIS — Z125 Encounter for screening for malignant neoplasm of prostate: Secondary | ICD-10-CM

## 2023-02-05 DIAGNOSIS — Z23 Encounter for immunization: Secondary | ICD-10-CM | POA: Diagnosis not present

## 2023-02-05 DIAGNOSIS — I10 Essential (primary) hypertension: Secondary | ICD-10-CM | POA: Diagnosis not present

## 2023-02-05 DIAGNOSIS — R3121 Asymptomatic microscopic hematuria: Secondary | ICD-10-CM

## 2023-02-05 DIAGNOSIS — R7303 Prediabetes: Secondary | ICD-10-CM

## 2023-02-05 LAB — POCT URINALYSIS DIP (CLINITEK)
Bilirubin, UA: NEGATIVE
Glucose, UA: NEGATIVE mg/dL
Ketones, POC UA: NEGATIVE mg/dL
Leukocytes, UA: NEGATIVE
Nitrite, UA: NEGATIVE
POC PROTEIN,UA: NEGATIVE
Spec Grav, UA: 1.02 (ref 1.010–1.025)
Urobilinogen, UA: 0.2 U/dL
pH, UA: 6 (ref 5.0–8.0)
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
## 2023-02-07 ENCOUNTER — Encounter: Payer: Self-pay | Admitting: Physician Assistant

## 2023-02-07 LAB — URINE CULTURE: Organism ID, Bacteria: NO GROWTH

## 2023-02-08 ENCOUNTER — Encounter: Payer: Self-pay | Admitting: Physician Assistant

## 2023-02-08 MED ORDER — CIPROFLOXACIN HCL 500 MG PO TABS: 500.0000 mg | ORAL_TABLET | Freq: Two times a day (BID) | ORAL | 0 refills | Status: AC

## 2023-02-08 MED ORDER — TAMSULOSIN HCL 0.4 MG PO CAPS: 0.4000 mg | ORAL_CAPSULE | Freq: Every day | ORAL | 3 refills | Status: DC

## 2023-02-09 DIAGNOSIS — N3001 Acute cystitis with hematuria: Secondary | ICD-10-CM | POA: Insufficient documentation

## 2023-02-09 DIAGNOSIS — R600 Localized edema: Secondary | ICD-10-CM | POA: Insufficient documentation

## 2023-02-09 DIAGNOSIS — Z23 Encounter for immunization: Secondary | ICD-10-CM

## 2023-02-09 DIAGNOSIS — Z87898 Personal history of other specified conditions: Secondary | ICD-10-CM | POA: Insufficient documentation

## 2023-02-09 DIAGNOSIS — Z0000 Encounter for general adult medical examination without abnormal findings: Secondary | ICD-10-CM

## 2023-02-09 HISTORY — DX: Encounter for immunization: Z23

## 2023-02-09 HISTORY — DX: Personal history of other specified conditions: Z87.898

## 2023-02-09 HISTORY — DX: Localized edema: R60.0

## 2023-02-09 HISTORY — DX: Acute cystitis with hematuria: N30.01

## 2023-02-09 HISTORY — DX: Encounter for general adult medical examination without abnormal findings: Z00.00

## 2023-02-09 MED ORDER — MONTELUKAST SODIUM 10 MG PO TABS: 10.0000 mg | ORAL_TABLET | Freq: Every day | ORAL | 3 refills | Status: DC

## 2023-02-10 ENCOUNTER — Ambulatory Visit: Payer: 59

## 2023-02-16 ENCOUNTER — Encounter: Payer: Self-pay | Admitting: Physician Assistant

## 2023-02-27 ENCOUNTER — Other Ambulatory Visit: Payer: Self-pay | Admitting: Physician Assistant

## 2023-03-03 ENCOUNTER — Encounter (HOSPITAL_BASED_OUTPATIENT_CLINIC_OR_DEPARTMENT_OTHER): Payer: Self-pay

## 2023-03-08 ENCOUNTER — Ambulatory Visit: Payer: 59 | Admitting: Physician Assistant

## 2023-03-08 ENCOUNTER — Encounter: Payer: Self-pay | Admitting: Physician Assistant

## 2023-03-08 VITALS — BP 122/74 | HR 84 | Temp 97.3°F | Resp 18 | Ht 70.0 in | Wt 309.6 lb

## 2023-03-08 DIAGNOSIS — E782 Mixed hyperlipidemia: Secondary | ICD-10-CM

## 2023-03-08 DIAGNOSIS — I10 Essential (primary) hypertension: Secondary | ICD-10-CM

## 2023-03-08 DIAGNOSIS — R0601 Orthopnea: Secondary | ICD-10-CM

## 2023-03-08 DIAGNOSIS — R310 Gross hematuria: Secondary | ICD-10-CM

## 2023-03-08 DIAGNOSIS — I517 Cardiomegaly: Secondary | ICD-10-CM | POA: Insufficient documentation

## 2023-03-08 DIAGNOSIS — R7303 Prediabetes: Secondary | ICD-10-CM | POA: Diagnosis not present

## 2023-03-08 DIAGNOSIS — E039 Hypothyroidism, unspecified: Secondary | ICD-10-CM

## 2023-03-08 DIAGNOSIS — Z125 Encounter for screening for malignant neoplasm of prostate: Secondary | ICD-10-CM

## 2023-03-08 HISTORY — DX: Orthopnea: R06.01

## 2023-03-08 HISTORY — DX: Cardiomegaly: I51.7

## 2023-03-08 NOTE — Progress Notes (Addendum)
 Subjective:  Patient ID: Cody Romero, male    DOB: 08-05-1950  Age: 73 y.o. MRN: [id number]  Chief Complaint  Patient presents with   Medical Management of Chronic Issues    HPI  Pt here for follow up after being seen for physical and having multiple concerns/issues Pt was seen on 02/04/21  and was supposed to return to get labwork done and did not come back until now (which was scheduled for his 1 month follow up)  Pt had stated a few months ago he had a few episodes of gross hematuria and then at appt was having polyuria and nocturia.  He was scheduled for labwork, had a urine culture done, and CT urogram was ordered.  He was also prescribed flomax.  Pt states his symptoms stopped the day after coming here and he never took the flomax.  He also did not schedule the urogram.  I explained today the reason for ordering the CT urogram and now he says he has not seen gross blood in over a year instead of several episodes a few months ago.  He states he is not able to repeat ua again today His urine culture was negative from last visit Have advised pt to get labwork today (to include CMP and PSA) and to call [facility] to have CT urogram done (number given to pt) Also will send pt with cup to obtain urine specimen that he can bring back to office when he is able to get   Pt stated at last visit he has had a cough since the fall - he had a chest xray done which was stable but was noted to have cardiomegaly.- was advised given his history to follow up with cardiology but he did not respond to message.  I prescribed singulair to try which he states he did not fill and not going to take. Discussed with patient today and he states that he has now had a cough for over 15 years and has always been told with prior xrays from other providers he had an enlarged heart (despite not having seen cardiology/echo in past)  Advise pt with finding of cardiomegaly, HTN, prediabetes, hyperlipidemia I do  recommend a cardiology consult and he agrees to be scheduled Of note at last visit he said he had some orthopnea as well as lower extremity edema also - BNP has been ordered again  Pt with hypothyroidism - due to check TSH - taking synthroid qd  Pt with prediabetes but was placed on glucophage in the past - not checking glucose regularly  Pt presents for follow up of hypertension. Taking benicar 40mg  qd - states he is not having chest pain or shortness of breath today  Mixed hyperlipidemia  Pt presents with hyperlipidemia. Taking crestor 20mg  qd - due for labwork       07/30/2022   10:08 AM 11/20/2021    7:36 AM 08/15/2021    7:31 AM 10/30/2020    8:30 AM 10/30/2020    8:13 AM  Depression screen PHQ 2/9  Decreased Interest 1 0 0 3 0  Down, Depressed, Hopeless 1 0 0 0 0  PHQ - 2 Score 2 0 0 3 0  Altered sleeping 0 0 0 3   Tired, decreased energy 1 0 3 3   Change in appetite 1 0 0 0   Feeling bad or failure about yourself  0 0 0 0   Trouble concentrating 0 0 0 1   Moving slowly  or fidgety/restless 0 0 0 0   Suicidal thoughts 0 0 0 0   PHQ-9 Score 4 0 3 10   Difficult doing work/chores Somewhat difficult Not difficult at all Not difficult at all Not difficult at all         10/09/2020    8:14 AM 10/23/2020    3:50 PM 10/30/2020    8:13 AM 11/20/2021    7:35 AM 07/30/2022   10:08 AM  Fall Risk  Falls in the past year? 0 0 0 0 0  Was there an injury with Fall? 0 0 0 0 0  Fall Risk Category Calculator 0 0 0 0 0  Fall Risk Category (Retired) Low Low Low Low   (RETIRED) Patient Fall Risk Level Low fall risk Low fall risk Low fall risk Low fall risk   Patient at Risk for Falls Due to No Fall Risks No Fall Risks No Fall Risks No Fall Risks No Fall Risks  Fall risk Follow up Falls evaluation completed Falls evaluation completed Falls evaluation completed Falls evaluation completed Falls evaluation completed    CONSTITUTIONAL: Negative for chills, fatigue, fever,  unintentional weight gain and unintentional weight loss.  E/N/T: Negative for ear pain, nasal congestion and sore throat.  CARDIOVASCULAR: Negative for chest pain, dizziness, palpitations and pedal edema.  RESPIRATORY: see HPI GASTROINTESTINAL: Negative for abdominal pain, acid reflux symptoms, constipation, diarrhea, nausea and vomiting.  GU - see HPI MSK: Negative for arthralgias and myalgias.  INTEGUMENTARY: Negative for rash.  PSYCHIATRIC: Negative for sleep disturbance and to question depression screen.  Negative for depression, negative for anhedonia.       Current Outpatient Medications:    Ascorbic Acid (VITAMIN C) 1000 MG tablet, Take 1,000 mg by mouth daily., Disp: , Rfl:    budesonide (RHINOCORT ALLERGY) 32 MCG/ACT nasal spray, Place 1 spray into both nostrils daily., Disp: , Rfl:    Cholecalciferol (VITAMIN D3 PO), Take 1 tablet by mouth at bedtime. , Disp: , Rfl:    Cyanocobalamin (B-12 PO), Take 1 tablet by mouth at bedtime. , Disp: , Rfl:    ELIQUIS 5 MG TABS tablet, TAKE 1 TABLET BY MOUTH TWICE A DAY, Disp: 60 tablet, Rfl: 1   levocetirizine (XYZAL) 5 MG tablet, TAKE 1 TABLET BY MOUTH EVERY DAY IN THE EVENING, Disp: 30 tablet, Rfl: 5   levothyroxine (SYNTHROID) 112 MCG tablet, Take 1 tablet (112 mcg total) by mouth daily., Disp: 90 tablet, Rfl: 0   metFORMIN (GLUCOPHAGE) 500 MG tablet, TAKE 1 TABLET BY MOUTH TWICE A DAY WITH FOOD, Disp: 180 tablet, Rfl: 0   Misc Natural Products (BEET ROOT PO), Take by mouth., Disp: , Rfl:    Multiple Vitamin (MULTIVITAMIN WITH MINERALS) TABS tablet, Take 1 tablet by mouth at bedtime., Disp: , Rfl:    olmesartan (BENICAR) 40 MG tablet, TAKE 1 TABLET BY MOUTH EVERYDAY AT BEDTIME, Disp: 30 tablet, Rfl: 2   omeprazole (PRILOSEC) 20 MG capsule, TAKE 1 CAPSULE BY MOUTH EVERY DAY, Disp: 90 capsule, Rfl: 1   rosuvastatin (CRESTOR) 20 MG tablet, TAKE 1 TABLET BY MOUTH DAILY AT 6 PM., Disp: 90 tablet, Rfl: 0   Zinc 50 MG CAPS, Take 50 mg by mouth  daily., Disp: , Rfl:   Past Medical History:  Diagnosis Date   BPH (benign prostatic hypertrophy)    Cancer (HCC)    Colon polyps    DVT (deep venous thrombosis) (HCC) 1999   6 months of coumadin   Gout  unconfirmed diagnosis   History of kidney stones    Hyperlipidemia    Hypertension    Melanoma in situ (HCC)    Back   Objective:  PHYSICAL EXAM:   VS: BP 122/74 (BP Location: Left Arm, Patient Position: Sitting, Cuff Size: Large)   Pulse 84   Temp (!) 97.3 F (36.3 C) (Temporal)   Resp 18   Ht 5\' 10"  (1.778 m)   Wt (!) 309 lb 9.6 oz (140.4 kg)   SpO2 94%   BMI 44.42 kg/m   GEN: Well nourished, well developed, in no acute distress   Cardiac: RRR; no murmurs, rubs, or gallops - mild pitting edema noted Respiratory:  normal respiratory rate and pattern with no distress - normal breath sounds with no rales, rhonchi, wheezes or rubs GI: normal bowel sounds, no masses or tenderness MS: no deformity or atrophy  Skin: warm and dry, no rash  Neuro:  Alert and Oriented x 3, - CN II-Xii grossly intact Psych: euthymic mood, appropriate affect and demeanor  Pt will not get ua Assessment & Plan:    Acquired hypothyroidism -     TSH Continue med Primary hypertension -     CBC with Differential/Platelet -     Comprehensive metabolic panel Continue med Mixed hyperlipidemia -     Lipid panel Continue med Watch diet Prediabetes -     Hemoglobin A1c Watch diet Prostate cancer screening -     PSA  Gross hematuria Pt not able to repeat ua today - cup given to bring back specimen Pt did not go for CT urogram - given number of [facility] to reschedule Pt refuses urology consult  Cardiomegaly -     Ambulatory referral to Cardiology  Orthopnea -     Ambulatory referral to Cardiology BNP ordered    Follow-up: Return in about 6 months (around 09/08/2023) for chronic fasting follow-up.  An After Visit Summary was printed and given to the patient.  Jettie Pagan [facility] [phone]

## 2023-03-09 ENCOUNTER — Other Ambulatory Visit: Payer: Self-pay | Admitting: Physician Assistant

## 2023-03-09 DIAGNOSIS — E039 Hypothyroidism, unspecified: Secondary | ICD-10-CM

## 2023-03-09 LAB — COMPREHENSIVE METABOLIC PANEL
ALT: 21 IU/L (ref 0–44)
AST: 30 IU/L (ref 0–40)
Albumin: 3.9 g/dL (ref 3.9–4.9)
Alkaline Phosphatase: 76 IU/L (ref 44–121)
BUN/Creatinine Ratio: 14 (ref 10–24)
BUN: 14 mg/dL (ref 8–27)
Bilirubin Total: 0.3 mg/dL (ref 0.0–1.2)
CO2: 26 mmol/L (ref 20–29)
Calcium: 9.2 mg/dL (ref 8.6–10.2)
Chloride: 105 mmol/L (ref 96–106)
Creatinine, Ser: 1.01 mg/dL (ref 0.76–1.27)
Globulin, Total: 2.6 g/dL (ref 1.5–4.5)
Glucose: 99 mg/dL (ref 70–99)
Potassium: 4.6 mmol/L (ref 3.5–5.2)
Sodium: 143 mmol/L (ref 134–144)
Total Protein: 6.5 g/dL (ref 6.0–8.5)
eGFR: 80 mL/min/{1.73_m2} (ref >59)

## 2023-03-09 LAB — CBC WITH DIFFERENTIAL/PLATELET
Basophils Absolute: 0.1 10*3/uL (ref 0.0–0.2)
Basos: 1 %
EOS (ABSOLUTE): 0.1 10*3/uL (ref 0.0–0.4)
Eos: 2 %
Hematocrit: 43.6 % (ref 37.5–51.0)
Hemoglobin: 14.8 g/dL (ref 13.0–17.7)
Immature Grans (Abs): 0 10*3/uL (ref 0.0–0.1)
Immature Granulocytes: 0 %
Lymphocytes Absolute: 1.6 10*3/uL (ref 0.7–3.1)
Lymphs: 29 %
MCH: 33.5 pg — ABNORMAL HIGH (ref 26.6–33.0)
MCHC: 33.9 g/dL (ref 31.5–35.7)
MCV: 99 fL — ABNORMAL HIGH (ref 79–97)
Monocytes Absolute: 0.6 10*3/uL (ref 0.1–0.9)
Monocytes: 11 %
Neutrophils Absolute: 3.2 10*3/uL (ref 1.4–7.0)
Neutrophils: 57 %
Platelets: 238 10*3/uL (ref 150–450)
RBC: 4.42 x10E6/uL (ref 4.14–5.80)
RDW: 12.3 % (ref 11.6–15.4)
WBC: 5.6 10*3/uL (ref 3.4–10.8)

## 2023-03-09 LAB — LIPID PANEL
Chol/HDL Ratio: 3.8 ratio (ref 0.0–5.0)
Cholesterol, Total: 137 mg/dL (ref 100–199)
HDL: 36 mg/dL — ABNORMAL LOW (ref >39)
LDL Chol Calc (NIH): 80 mg/dL (ref 0–99)
Triglycerides: 113 mg/dL (ref 0–149)
VLDL Cholesterol Cal: 21 mg/dL (ref 5–40)

## 2023-03-09 LAB — PRO B NATRIURETIC PEPTIDE: NT-Pro BNP: <36 pg/mL (ref 0–376)

## 2023-03-09 LAB — HEMOGLOBIN A1C
Est. average glucose Bld gHb Est-mCnc: 120 mg/dL
Hgb A1c MFr Bld: 5.8 % — ABNORMAL HIGH (ref 4.8–5.6)

## 2023-03-09 LAB — TSH: TSH: 5.53 u[IU]/mL — ABNORMAL HIGH (ref 0.450–4.500)

## 2023-03-09 LAB — PSA: Prostate Specific Ag, Serum: 0.3 ng/mL (ref 0.0–4.0)

## 2023-03-09 MED ORDER — LEVOTHYROXINE SODIUM 125 MCG PO TABS: 125.0000 ug | ORAL_TABLET | Freq: Every day | ORAL | 3 refills | Status: DC

## 2023-03-18 ENCOUNTER — Ambulatory Visit

## 2023-03-18 ENCOUNTER — Other Ambulatory Visit: Payer: Self-pay

## 2023-03-18 VITALS — BP 140/86 | HR 90 | Ht 70.0 in | Wt 311.4 lb

## 2023-03-18 DIAGNOSIS — I10 Essential (primary) hypertension: Secondary | ICD-10-CM

## 2023-03-18 DIAGNOSIS — E782 Mixed hyperlipidemia: Secondary | ICD-10-CM | POA: Diagnosis not present

## 2023-03-18 DIAGNOSIS — I517 Cardiomegaly: Secondary | ICD-10-CM

## 2023-03-18 MED ORDER — FUROSEMIDE 20 MG PO TABS: 20.0000 mg | ORAL_TABLET | Freq: Every day | ORAL | 3 refills | Status: DC

## 2023-03-18 NOTE — Assessment & Plan Note (Signed)
 Noted on prior chest x-rays. No prior diagnosis of heart failure other than right ventricular dilatation and dysfunction in the setting of acute PE back in November 2020.  No recent echocardiogram available.  Will obtain a transthoracic echocardiogram for further assessment.  He does have bilateral lower extremity edema. Start furosemide 20 mg once a day, to take this for the next 3 days consistently and after that to take it every other day.  Advised him to monitor his day-to-day weights.  If weight gain over 2 to 3 pounds within a day or 4 to 5 pounds within a week, to take additional 1 dose of furosemide 20 mg.

## 2023-03-18 NOTE — Patient Instructions (Signed)
 Limit salt intake to under 2 grams each day.    Medication Instructions:    For the next 3 days take Lasix 20 mg once a day, after the third day start taking it every other day  *If you need a refill on your cardiac medications before your next appointment, please call your pharmacy*   Lab Work: None Ordered If you have labs (blood work) drawn today and your tests are completely normal, you will receive your results only by: MyChart Message (if you have MyChart) OR A paper copy in the mail If you have any lab test that is abnormal or we need to change your treatment, we will call you to review the results.   Testing/Procedures: Echocardiogram An echocardiogram is a test that uses sound waves (ultrasound) to produce images of the heart. Images from an echocardiogram can provide important information about: Heart size and shape. The size and thickness and movement of your heart's walls. Heart muscle function and strength. Heart valve function or if you have stenosis. Stenosis is when the heart valves are too narrow. If blood is flowing backward through the heart valves (regurgitation). A tumor or infectious growth around the heart valves. Areas of heart muscle that are not working well because of poor blood flow or injury from a heart attack. Aneurysm detection. An aneurysm is a weak or damaged part of an artery wall. The wall bulges out from the normal force of blood pumping through the body. Tell a health care provider about: Any allergies you have. All medicines you are taking, including vitamins, herbs, eye drops, creams, and over-the-counter medicines. Any blood disorders you have. Any surgeries you have had. Any medical conditions you have. Whether you are pregnant or may be pregnant. What are the risks? Generally, this is a safe test. However, problems may occur, including an allergic reaction to dye (contrast) that may be used during the test. What happens before the  test? No specific preparation is needed. You may eat and drink normally. What happens during the test?  You will take off your clothes from the waist up and put on a hospital gown. Electrodes or electrocardiogram (ECG)patches may be placed on your chest. The electrodes or patches are then connected to a device that monitors your heart rate and rhythm. You will lie down on a table for an ultrasound exam. A gel will be applied to your chest to help sound waves pass through your skin. A handheld device, called a transducer, will be pressed against your chest and moved over your heart. The transducer produces sound waves that travel to your heart and bounce back (or "echo" back) to the transducer. These sound waves will be captured in real-time and changed into images of your heart that can be viewed on a video monitor. The images will be recorded on a computer and reviewed by your health care provider. You may be asked to change positions or hold your breath for a short time. This makes it easier to get different views or better views of your heart. In some cases, you may receive contrast through an IV in one of your veins. This can improve the quality of the pictures from your heart. The procedure may vary among health care providers and hospitals. What can I expect after the test? You may return to your normal, everyday life, including diet, activities, and medicines, unless your health care provider tells you not to do that. Follow these instructions at home: It is up to  you to get the results of your test. Ask your health care provider, or the department that is doing the test, when your results will be ready. Keep all follow-up visits. This is important. Summary An echocardiogram is a test that uses sound waves (ultrasound) to produce images of the heart. Images from an echocardiogram can provide important information about the size and shape of your heart, heart muscle function, heart valve  function, and other possible heart problems. You do not need to do anything to prepare before this test. You may eat and drink normally. After the echocardiogram is completed, you may return to your normal, everyday life, unless your health care provider tells you not to do that. This information is not intended to replace advice given to you by your health care provider. Make sure you discuss any questions you have with your health care provider. Document Revised: 09/04/2020 Document Reviewed: 08/15/2019 Elsevier Patient Education  2023 [employer]       Follow-Up: At [facility], you and your health needs are our priority.  As part of our continuing mission to provide you with exceptional heart care, we have created designated Provider Care Teams.  These Care Teams include your primary Cardiologist (physician) and Advanced Practice Providers (APPs -  Physician Assistants and Nurse Practitioners) who all work together to provide you with the care you need, when you need it.  We recommend signing up for the patient portal called "MyChart".  Sign up information is provided on this After Visit Summary.  MyChart is used to connect with patients for Virtual Visits (Telemedicine).  Patients are able to view lab/test results, encounter notes, upcoming appointments, etc.  Non-urgent messages can be sent to your provider as well.   To learn more about what you can do with MyChart, go to [email or web address].    Your next appointment:   3 month follow up
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
## 2023-03-18 NOTE — Telephone Encounter (Signed)
This is a [place] pt °

## 2023-03-18 NOTE — Assessment & Plan Note (Signed)
 Lipid panel total cholesterol 137, HDL 36, LDL 80, triglycerides 113 on 03-08-2022. Continue rosuvastatin 20 mg once daily.

## 2023-03-18 NOTE — Assessment & Plan Note (Signed)
 Suboptimal control today in the office.  Well-controlled at prior office visits with PCP. Currently on olmesartan 40 mg once daily.  Continue same. Advised to monitor blood pressures at home and if consistently above systolic 130s to notify us or the PCP.

## 2023-03-21 ENCOUNTER — Other Ambulatory Visit: Payer: Self-pay | Admitting: Physician Assistant

## 2023-03-25 ENCOUNTER — Ambulatory Visit

## 2023-03-25 DIAGNOSIS — I517 Cardiomegaly: Secondary | ICD-10-CM

## 2023-03-25 LAB — ECHOCARDIOGRAM COMPLETE: S' Lateral: 3.2 cm

## 2023-03-30 ENCOUNTER — Encounter: Payer: Self-pay | Admitting: Physician Assistant

## 2023-04-14 ENCOUNTER — Other Ambulatory Visit: Payer: Self-pay | Admitting: Physician Assistant

## 2023-05-04 ENCOUNTER — Other Ambulatory Visit: Payer: Self-pay | Admitting: Physician Assistant

## 2023-05-04 DIAGNOSIS — E039 Hypothyroidism, unspecified: Secondary | ICD-10-CM

## 2023-05-04 MED ORDER — LEVOTHYROXINE SODIUM 125 MCG PO TABS
125.0000 ug | ORAL_TABLET | Freq: Every day | ORAL | 0 refills | Status: DC
Start: 2023-05-04 — End: 2023-05-07

## 2023-05-05 ENCOUNTER — Other Ambulatory Visit (INDEPENDENT_AMBULATORY_CARE_PROVIDER_SITE_OTHER)

## 2023-05-05 DIAGNOSIS — Z87898 Personal history of other specified conditions: Secondary | ICD-10-CM

## 2023-05-05 DIAGNOSIS — E039 Hypothyroidism, unspecified: Secondary | ICD-10-CM

## 2023-05-05 LAB — POCT URINALYSIS DIP (CLINITEK)
Bilirubin, UA: NEGATIVE
Blood, UA: NEGATIVE
Glucose, UA: NEGATIVE mg/dL
Ketones, POC UA: NEGATIVE mg/dL
Leukocytes, UA: NEGATIVE
Nitrite, UA: NEGATIVE
POC PROTEIN,UA: NEGATIVE
Spec Grav, UA: 1.015 (ref 1.010–1.025)
Urobilinogen, UA: 0.2 U/dL
pH, UA: 6.5 (ref 5.0–8.0)

## 2023-05-06 LAB — TSH: TSH: 5.01 u[IU]/mL — ABNORMAL HIGH (ref 0.450–4.500)

## 2023-05-07 ENCOUNTER — Encounter: Payer: Self-pay | Admitting: Physician Assistant

## 2023-05-07 ENCOUNTER — Other Ambulatory Visit: Payer: Self-pay | Admitting: Physician Assistant

## 2023-05-07 DIAGNOSIS — E039 Hypothyroidism, unspecified: Secondary | ICD-10-CM

## 2023-05-07 MED ORDER — LEVOTHYROXINE SODIUM 137 MCG PO TABS: 137.0000 ug | ORAL_TABLET | Freq: Every day | ORAL | 2 refills | Status: DC

## 2023-05-10 ENCOUNTER — Other Ambulatory Visit: Payer: Self-pay | Admitting: Physician Assistant

## 2023-05-22 ENCOUNTER — Other Ambulatory Visit: Payer: Self-pay | Admitting: Physician Assistant

## 2023-06-22 ENCOUNTER — Ambulatory Visit

## 2023-06-23 ENCOUNTER — Other Ambulatory Visit: Payer: Self-pay | Admitting: Physician Assistant

## 2023-07-01 ENCOUNTER — Other Ambulatory Visit: Payer: Self-pay | Admitting: Physician Assistant

## 2023-07-04 ENCOUNTER — Other Ambulatory Visit: Payer: Self-pay | Admitting: Physician Assistant

## 2023-07-04 DIAGNOSIS — E039 Hypothyroidism, unspecified: Secondary | ICD-10-CM

## 2023-07-15 ENCOUNTER — Telehealth: Payer: Self-pay | Admitting: Physician Assistant

## 2023-07-15 NOTE — Telephone Encounter (Signed)
 Patient dropped off a form for physical activity. Requested to be called once the form is completed.

## 2023-07-19 ENCOUNTER — Telehealth: Payer: Self-pay

## 2023-07-19 ENCOUNTER — Telehealth: Payer: Self-pay | Admitting: Physician Assistant

## 2023-07-19 NOTE — Telephone Encounter (Signed)
 Patient made aware and stated he will come by and pick up form to take to his cardiologist.

## 2023-07-19 NOTE — Telephone Encounter (Signed)
 Did you call patient about paperwork  Copied from CRM [id number]. Topic: General - Other >> Jul 16, 2023  1:37 PM Santiya F wrote: Reason for CRM: Patient is calling in because he missed a call from the office. Please follow up with patient.

## 2023-07-19 NOTE — Telephone Encounter (Signed)
 Copied from CRM [id number]. Topic: Medical Record Request - Other >> Jul 19, 2023  1:48 PM Tiffini S wrote: Reason for CRM: Patient called for update for form dropped off for physical activity- he has a missed call and is asking for another call back at [phone].

## 2023-07-19 NOTE — Telephone Encounter (Signed)
 We had called pt and advised his cardiologist will need to fill out this form given his cardiac history - he was also due to follow up with cardiology in June according to their notes -- recommend he make follow up appt with them

## 2023-07-26 ENCOUNTER — Ambulatory Visit: Payer: Self-pay

## 2023-07-26 NOTE — Telephone Encounter (Signed)
 Called patient to let him know that he needs a follow up with cardiologist. Camie Moats, PA-C sent a message to cardiologist regarding to echocardiogram.

## 2023-07-26 NOTE — Telephone Encounter (Signed)
 Called patient and let him know that paper for release for activity is ready to be pick up. Camie Moats, PA-C recommended to follow up with cardiologist.

## 2023-07-29 ENCOUNTER — Ambulatory Visit: Payer: Self-pay

## 2023-07-29 VITALS — BP 120/80 | HR 84 | Ht 70.0 in | Wt 308.0 lb

## 2023-07-29 DIAGNOSIS — I77810 Thoracic aortic ectasia: Secondary | ICD-10-CM

## 2023-07-29 DIAGNOSIS — Z6841 Body Mass Index (BMI) 40.0 and over, adult: Secondary | ICD-10-CM

## 2023-07-29 DIAGNOSIS — I10 Essential (primary) hypertension: Secondary | ICD-10-CM

## 2023-07-29 HISTORY — DX: Thoracic aortic ectasia: I77.810

## 2023-07-29 MED ORDER — FUROSEMIDE 20 MG PO TABS: 20.0000 mg | ORAL_TABLET | ORAL | 3 refills | Status: DC

## 2023-07-29 NOTE — Patient Instructions (Signed)
 Medication Instructions:   Your physician recommends that you continue on your current medications as directed. Please refer to the Current Medication list given to you today.     *If you need a refill on your cardiac medications before your next appointment, please call your pharmacy*  Lab Work:  Your provider would like for you to have following labs drawn today BMET .    If you have labs (blood work) drawn today and your tests are completely normal, you will receive your results only by: MyChart Message (if you have MyChart) OR A paper copy in the mail If you have any lab test that is abnormal or we need to change your treatment, we will call you to review the results.  Testing/Procedures:  Chest CT with Contrast  Non-Cardiac CT scanning, (CAT scanning), is a noninvasive, special x-ray that produces cross-sectional images of the body using x-rays and a computer. CT scans help physicians diagnose and treat medical conditions. For some CT exams, a contrast material is used to enhance visibility in the area of the body being studied. CT scans provide greater clarity and reveal more details than regular x-ray exams.   Follow-Up: At [facility], you and your health needs are our priority.  As part of our continuing mission to provide you with exceptional heart care, our providers are all part of one team.  This team includes your primary Cardiologist (physician) and Advanced Practice Providers or APPs (Physician Assistants and Nurse Practitioners) who all work together to provide you with the care you need, when you need it.  Your next appointment:   6 month(s)  Provider:   Alean Kobus, MD    We recommend signing up for the patient portal called MyChart.  Sign up information is provided on this After Visit Summary.  MyChart is used to connect with patients for Virtual Visits (Telemedicine).  Patients are able to view lab/test results, encounter notes, upcoming  appointments, etc.  Non-urgent messages can be sent to your provider as well.   To learn more about what you can do with MyChart, go to [email or web address].

## 2023-07-29 NOTE — Assessment & Plan Note (Signed)
 Echocardiogram March 2025 ascending aorta reported 4.2 cm in size, considering his body surface area was mildly dilated range.  Comparison on prior CT chest from November 2020 measured 4.1 cm x 3.8 cm and appears stable images reviewed below under the screenshot.  Given family history of aortic aneurysm, we will proceed with formal comprehensive assessment with CT chest with contrast for surveillance change in comparison to prior CT from November 2020.SABRA
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
## 2023-07-29 NOTE — Assessment & Plan Note (Addendum)
 Advised family to continue with dietary lifestyle modifications to target weight loss.. Importance of limiting nighttime carbohydrate consumption and no snacks or treats after supper.  Recommended to continue regular aerobic training, recommended continue walking on treadmill 30 to 40 minutes 5 times a week with heart rates not exceeding 110 to 120 bpm.

## 2023-07-29 NOTE — Assessment & Plan Note (Signed)
 Well-controlled. Continue current medication olmesartan  40 mg once daily.

## 2023-07-30 LAB — BASIC METABOLIC PANEL WITH GFR
BUN/Creatinine Ratio: 18 (ref 10–24)
BUN: 18 mg/dL (ref 8–27)
CO2: 24 mmol/L (ref 20–29)
Calcium: 9.3 mg/dL (ref 8.6–10.2)
Chloride: 103 mmol/L (ref 96–106)
Creatinine, Ser: 1.02 mg/dL (ref 0.76–1.27)
Glucose: 111 mg/dL — ABNORMAL HIGH (ref 70–99)
Potassium: 4.4 mmol/L (ref 3.5–5.2)
Sodium: 143 mmol/L (ref 134–144)
eGFR: 79 mL/min/1.73 (ref >59)

## 2023-08-03 ENCOUNTER — Other Ambulatory Visit (HOSPITAL_BASED_OUTPATIENT_CLINIC_OR_DEPARTMENT_OTHER): Admitting: Radiology

## 2023-08-03 ENCOUNTER — Encounter: Payer: Self-pay | Admitting: Physician Assistant

## 2023-08-04 ENCOUNTER — Other Ambulatory Visit: Payer: Self-pay | Admitting: Physician Assistant

## 2023-08-04 DIAGNOSIS — I10 Essential (primary) hypertension: Secondary | ICD-10-CM

## 2023-08-04 DIAGNOSIS — E039 Hypothyroidism, unspecified: Secondary | ICD-10-CM

## 2023-08-04 DIAGNOSIS — Z7901 Long term (current) use of anticoagulants: Secondary | ICD-10-CM

## 2023-08-04 MED ORDER — LEVOTHYROXINE SODIUM 137 MCG PO TABS
137.0000 ug | ORAL_TABLET | Freq: Every day | ORAL | 1 refills | Status: AC
Start: 2023-08-04 — End: ?

## 2023-08-04 MED ORDER — APIXABAN 5 MG PO TABS: 5.0000 mg | ORAL_TABLET | Freq: Two times a day (BID) | ORAL | 1 refills | Status: AC

## 2023-08-04 MED ORDER — FUROSEMIDE 20 MG PO TABS: 20.0000 mg | ORAL_TABLET | ORAL | 1 refills | Status: DC

## 2023-08-07 ENCOUNTER — Encounter (HOSPITAL_BASED_OUTPATIENT_CLINIC_OR_DEPARTMENT_OTHER): Payer: Self-pay | Admitting: Radiology

## 2023-08-12 ENCOUNTER — Inpatient Hospital Stay (HOSPITAL_BASED_OUTPATIENT_CLINIC_OR_DEPARTMENT_OTHER)
Admission: RE | Admit: 2023-08-12 | Discharge: 2023-08-12 | Disposition: A | Discharge status: Home / Self Care | Source: Ambulatory Visit | Admitting: Radiology

## 2023-08-12 DIAGNOSIS — I700 Atherosclerosis of aorta: Secondary | ICD-10-CM | POA: Diagnosis not present

## 2023-08-12 DIAGNOSIS — I77810 Thoracic aortic ectasia: Secondary | ICD-10-CM

## 2023-08-12 MED ORDER — IOHEXOL 300 MG/ML  SOLN
100.0000 mL | Freq: Once | INTRAMUSCULAR | Status: AC | PRN
  Administered 2023-08-12: 100 mL via INTRAVENOUS

## 2023-08-16 ENCOUNTER — Other Ambulatory Visit: Payer: Self-pay

## 2023-08-16 MED ORDER — OLMESARTAN MEDOXOMIL 40 MG PO TABS: ORAL_TABLET | ORAL | 0 refills | Status: DC

## 2023-08-16 MED ORDER — ROSUVASTATIN CALCIUM 20 MG PO TABS: 20.0000 mg | ORAL_TABLET | Freq: Every day | ORAL | 0 refills | Status: DC

## 2023-08-18 ENCOUNTER — Ambulatory Visit: Payer: Self-pay

## 2023-08-24 ENCOUNTER — Other Ambulatory Visit: Payer: Self-pay | Admitting: Physician Assistant

## 2023-08-24 NOTE — Telephone Encounter (Signed)
 Copied from CRM #[id number]. Topic: Clinical - Medication Refill >> Aug 24, 2023  8:57 AM Kevelyn M wrote: Medication:olmesartan  (BENICAR ) 40 MG tablet, rosuvastatin  (CRESTOR ) 20 MG tablet  Has the patient contacted their pharmacy? Yes (Agent: If no, request that the patient contact the pharmacy for the refill. If patient does not wish to contact the pharmacy document the reason why and proceed with request.) (Agent: If yes, when and what did the pharmacy advise?)  This is the patient's preferred pharmacy:   [facility] (Now [facility]) - [address] [place], [place] - [address] [place] [address] Phone: [phone] Fax: [phone]  Is this the correct pharmacy for this prescription? Yes If no, delete pharmacy and type the correct one.   Has the prescription been filled recently? Yes  Is the patient out of the medication? Not sure  Has the patient been seen for an appointment in the last year OR does the patient have an upcoming appointment? Yes  Can we respond through MyChart? No  Agent: Please be advised that Rx refills may take up to 3 business days. We ask that you follow-up with your pharmacy.

## 2023-08-27 ENCOUNTER — Other Ambulatory Visit: Payer: Self-pay | Admitting: Family Medicine

## 2023-08-31 ENCOUNTER — Other Ambulatory Visit: Payer: Self-pay

## 2023-08-31 ENCOUNTER — Encounter: Payer: Self-pay | Admitting: Physician Assistant

## 2023-09-01 ENCOUNTER — Telehealth: Payer: Self-pay

## 2023-09-01 MED ORDER — ROSUVASTATIN CALCIUM 20 MG PO TABS: 20.0000 mg | ORAL_TABLET | Freq: Every day | ORAL | 0 refills | Status: DC

## 2023-09-01 NOTE — Telephone Encounter (Signed)
 Patient Made Aware, Verbalized Understanding. No questions at this time. And schedule him  his annual wellness visit.

## 2023-09-01 NOTE — Telephone Encounter (Signed)
 There was a message sent by cardiology that incidental small nodules noted on lungs were noted on his exam and it is recommended that a chest CT be repeated in 12 months That was noted in his chart but no other message came to me from patient Does he have other questions besides results what he was told from cardiology?

## 2023-09-01 NOTE — Telephone Encounter (Signed)
 Have you had a chance to view his CT  Copied from CRM #[id number]. Topic: Clinical - Lab/Test Results >> Aug 31, 2023  4:41 PM Viola F wrote: Reason for CRM: Patient says he sent MyChart message over a week ago to discuss CT Scan results, I do not see message but patient would like a call regarding results. Please call [phone] (M)

## 2023-09-07 ENCOUNTER — Ambulatory Visit (INDEPENDENT_AMBULATORY_CARE_PROVIDER_SITE_OTHER)

## 2023-09-07 VITALS — Wt 300.0 lb

## 2023-09-07 DIAGNOSIS — Z0000 Encounter for general adult medical examination without abnormal findings: Secondary | ICD-10-CM
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
## 2023-09-09 ENCOUNTER — Ambulatory Visit: Admitting: Physician Assistant

## 2023-09-09 ENCOUNTER — Encounter: Payer: Self-pay | Admitting: Physician Assistant

## 2023-09-09 VITALS — BP 124/78 | HR 68 | Temp 98.2°F | Resp 18 | Ht 70.0 in | Wt 304.0 lb

## 2023-09-09 DIAGNOSIS — E039 Hypothyroidism, unspecified: Secondary | ICD-10-CM | POA: Diagnosis not present

## 2023-09-09 DIAGNOSIS — I10 Essential (primary) hypertension: Secondary | ICD-10-CM | POA: Diagnosis not present

## 2023-09-09 DIAGNOSIS — E782 Mixed hyperlipidemia: Secondary | ICD-10-CM | POA: Diagnosis not present

## 2023-09-09 DIAGNOSIS — I77819 Aortic ectasia, unspecified site: Secondary | ICD-10-CM

## 2023-09-09 DIAGNOSIS — Z7901 Long term (current) use of anticoagulants: Secondary | ICD-10-CM | POA: Diagnosis not present

## 2023-09-09 DIAGNOSIS — Z23 Encounter for immunization: Secondary | ICD-10-CM | POA: Diagnosis not present

## 2023-09-09 DIAGNOSIS — R918 Other nonspecific abnormal finding of lung field: Secondary | ICD-10-CM | POA: Diagnosis not present

## 2023-09-09 DIAGNOSIS — Z87898 Personal history of other specified conditions: Secondary | ICD-10-CM | POA: Diagnosis not present

## 2023-09-09 DIAGNOSIS — R7303 Prediabetes: Secondary | ICD-10-CM

## 2023-09-09 DIAGNOSIS — G4733 Obstructive sleep apnea (adult) (pediatric): Secondary | ICD-10-CM | POA: Diagnosis not present

## 2023-09-09 LAB — POCT URINALYSIS DIP (CLINITEK)
Bilirubin, UA: NEGATIVE
Blood, UA: NEGATIVE
Glucose, UA: NEGATIVE mg/dL
Ketones, POC UA: NEGATIVE mg/dL
Leukocytes, UA: NEGATIVE
Nitrite, UA: NEGATIVE
POC PROTEIN,UA: NEGATIVE
Spec Grav, UA: 1.015 (ref 1.010–1.025)
Urobilinogen, UA: 0.2 U/dL
pH, UA: 6 (ref 5.0–8.0)

## 2023-09-09 NOTE — Progress Notes (Signed)
 Subjective:  Patient ID: Cody Romero, male    DOB: 01/03/1951  Age: 73 y.o. MRN: [id number]  Chief Complaint  Patient presents with   Medical Management of Chronic Issues    HPI  Pt had stated a few months ago he had a few episodes of gross hematuria and then at appt was having polyuria and nocturia.  He was scheduled for labwork, had a urine culture done, and CT urogram was ordered.  He was also prescribed flomax .  Pt states his symptoms stopped the day after coming here and he never took the flomax .  He also did not schedule the urogram. He states he has had no more gross hematuria but will repeat ua today Despite history of gross hematuria pt still defers further evaluation of ultrasound or urogram  Pt with hypothyroidism - due to check TSH - taking synthroid  137 mcg qd  Pt with prediabetes but was placed on glucophage  in the past - not checking glucose regularly  Pt presents for follow up of hypertension. Taking benicar  40mg  qd - states he is not having chest pain or shortness of breath today He is also taking lasix  20mg  qd Pt had cardiology consult recently with enlargement of aorta noted on echocardiogram (will monitor yearly) otherwise EF nomral at 60-65% Also noted on CT were pulmonary nodules - CT recommended to repeat in one year as well  Mixed hyperlipidemia  Pt presents with hyperlipidemia. Taking crestor  20mg  qd - due for labwork  Pt with history of LLE DVT in 1999 and then had PE in 11/1998 - since that time has been on Eliquis   Pt states that he has history of obstructive sleep apnea.  He said he had a sleep study years ago and was offered a CPAP machine but declined at that time.  He would like to see about getting one now ---- discussed I will need to review his records and see how long ago that was and if new sleep study test is going to be needed first  Pt would like flu shot today    09/09/2023    8:10 AM 09/07/2023   12:48 PM 07/30/2022   10:08 AM 11/20/2021     7:36 AM 08/15/2021    7:31 AM  Depression screen PHQ 2/9  Decreased Interest 0 0 1 0 0  Down, Depressed, Hopeless 1 0 1 0 0  PHQ - 2 Score 1 0 2 0 0  Altered sleeping 0  0 0 0  Tired, decreased energy 0  1 0 3  Change in appetite 0  1 0 0  Feeling bad or failure about yourself  0  0 0 0  Trouble concentrating 0  0 0 0  Moving slowly or fidgety/restless 0  0 0 0  Suicidal thoughts 0  0 0 0  PHQ-9 Score 1  4 0 3  Difficult doing work/chores Not difficult at all  Somewhat difficult Not difficult at all Not difficult at all        10/30/2020    8:13 AM 11/20/2021    7:35 AM 07/30/2022   10:08 AM 09/03/2023   11:12 AM 09/09/2023    8:09 AM  Fall Risk  Falls in the past year? 0 0 0 0 0  Was there an injury with Fall? 0 0 0 0 0  Fall Risk Category Calculator 0 0 0 0  0  Fall Risk Category (Retired) Low  Low      (RETIRED) Patient Fall Risk Level  Low fall risk  Low fall risk      Patient at Risk for Falls Due to No Fall Risks No Fall Risks No Fall Risks  History of fall(s)  Fall risk Follow up Falls evaluation completed  Falls evaluation completed  Falls evaluation completed       Patient-reported   Data saved with a previous flowsheet row definition    CONSTITUTIONAL: Negative for chills, fatigue, fever, unintentional weight gain and unintentional weight loss.  E/N/T: Negative for ear pain, nasal congestion and sore throat.  CARDIOVASCULAR: Negative for chest pain, dizziness, palpitations and pedal edema.  RESPIRATORY: Negative for recent cough and dyspnea.  GASTROINTESTINAL: Negative for abdominal pain, acid reflux symptoms, constipation, diarrhea, nausea and vomiting.  GU - no gross hematuria MSK: Negative for arthralgias and myalgias.  INTEGUMENTARY: Negative for rash.  NEUROLOGICAL: Negative for dizziness and headaches.  PSYCHIATRIC: Negative for sleep disturbance and to question depression screen.  Negative for depression, negative for anhedonia.        Current Outpatient  Medications:    apixaban  (ELIQUIS ) 5 MG TABS tablet, Take 1 tablet (5 mg total) by mouth 2 (two) times daily., Disp: 180 tablet, Rfl: 1   Ascorbic Acid (VITAMIN C) 1000 MG tablet, Take 1,000 mg by mouth daily., Disp: , Rfl:    budesonide  (RHINOCORT  ALLERGY) 32 MCG/ACT nasal spray, Place 1 spray into both nostrils daily., Disp: , Rfl:    Cholecalciferol (VITAMIN D3 PO), Take 1 tablet by mouth at bedtime. , Disp: , Rfl:    cyanocobalamin 1000 MCG tablet, Take 1,000 mcg by mouth daily., Disp: , Rfl:    furosemide  (LASIX ) 20 MG tablet, Take 1 tablet (20 mg total) by mouth every other day. Take 1 tablet once a day for three days, then take every other day, Disp: 45 tablet, Rfl: 1   levothyroxine  (SYNTHROID ) 137 MCG tablet, Take 1 tablet (137 mcg total) by mouth daily before breakfast., Disp: 90 tablet, Rfl: 1   metFORMIN  (GLUCOPHAGE ) 500 MG tablet, TAKE 1 TABLET BY MOUTH TWICE A DAY WITH FOOD, Disp: 180 tablet, Rfl: 1   Misc Natural Products (BEET ROOT PO), Take 1 tablet by mouth daily., Disp: , Rfl:    Multiple Vitamin (MULTIVITAMIN WITH MINERALS) TABS tablet, Take 1 tablet by mouth at bedtime., Disp: , Rfl:    olmesartan  (BENICAR ) 40 MG tablet, TAKE 1 TABLET BY MOUTH EVERYDAY AT BEDTIME, Disp: 90 tablet, Rfl: 0   rosuvastatin  (CRESTOR ) 20 MG tablet, Take 1 tablet (20 mg total) by mouth daily., Disp: 90 tablet, Rfl: 0   Zinc 50 MG CAPS, Take 50 mg by mouth daily., Disp: , Rfl:   Past Medical History:  Diagnosis Date   Allergy    BPH (benign prostatic hypertrophy)    Cancer (HCC)    Colon polyps    Depression    DVT (deep venous thrombosis) (HCC) 1999   6 months of coumadin   GERD (gastroesophageal reflux disease)    Gout    unconfirmed diagnosis   History of kidney stones    Hyperlipidemia    Hypertension    Melanoma in situ (HCC)    Back   Sleep apnea    Objective:  PHYSICAL EXAM:   VS: BP 124/78   Pulse 68   Temp 98.2 F (36.8 C) (Temporal)   Resp 18   Ht 5' 10 (1.778 m)    Wt (!) 304 lb (137.9 kg)   SpO2 98%   BMI 43.62 kg/m   GEN:  Well nourished, well developed, in no acute distress   Cardiac: RRR; no murmurs, rubs, or gallops,no edema -  Respiratory:  normal respiratory rate and pattern with no distress - normal breath sounds with no rales, rhonchi, wheezes or rubs  MS: no deformity or atrophy  Skin: warm and dry, no rash  Neuro:  Alert and Oriented x 3, - CN II-Xii grossly intact Psych: euthymic mood, appropriate affect and demeanor Office Visit on 09/09/2023  Component Date Value Ref Range Status   Color, UA 09/09/2023 yellow  yellow Final   Clarity, UA 09/09/2023 clear  clear Final   Glucose, UA 09/09/2023 negative  negative mg/dL Final   Bilirubin, UA 90/95/7974 negative  negative Final   Ketones, POC UA 09/09/2023 negative  negative mg/dL Final   Spec Grav, UA 90/95/7974 1.015  1.010 - 1.025 Final   Blood, UA 09/09/2023 negative  negative Final   pH, UA 09/09/2023 6.0  5.0 - 8.0 Final   POC PROTEIN,UA 09/09/2023 negative  negative, trace Final   Urobilinogen, UA 09/09/2023 0.2  0.2 or 1.0 E.U./dL Final   Nitrite, UA 90/95/7974 Negative  Negative Final   Leukocytes, UA 09/09/2023 Negative  Negative Final    Assessment & Plan:    Acquired hypothyroidism -     TSH Continue med Primary hypertension -     CBC with Differential/Platelet -     Comprehensive metabolic panel Continue med Mixed hyperlipidemia -     Lipid panel Continue med Watch diet Prediabetes -     Hemoglobin A1c Watch diet Long term anticoagulant use Continue Eliquis  Gross hematuria Ua - normal Pt still defers further evaluation of prior gross hematuria Obstructive sleep apnea Will review records to see if new study needed to order CPAP Need flu shot Fluzone Hi given  Pulmonary nodules Repeat chest CT in 8/26 Aortic dilatation Will follow up yearly with repeat imaging Follow-up: Return in about 6 months (around 03/08/2024) for chronic fasting follow-up.  An  After Visit Summary was printed and given to the patient.  CAMIE JONELLE NICHOLAUS DEVONNA [facility] [phone]

## 2023-09-10 ENCOUNTER — Ambulatory Visit: Payer: Self-pay | Admitting: Physician Assistant

## 2023-09-10 LAB — COMPREHENSIVE METABOLIC PANEL WITH GFR
ALT: 25 IU/L (ref 0–44)
AST: 40 IU/L (ref 0–40)
Albumin: 4.1 g/dL (ref 3.8–4.8)
Alkaline Phosphatase: 74 IU/L (ref 44–121)
BUN/Creatinine Ratio: 14 (ref 10–24)
BUN: 15 mg/dL (ref 8–27)
Bilirubin Total: 0.7 mg/dL (ref 0.0–1.2)
CO2: 22 mmol/L (ref 20–29)
Calcium: 9.1 mg/dL (ref 8.6–10.2)
Chloride: 102 mmol/L (ref 96–106)
Creatinine, Ser: 1.11 mg/dL (ref 0.76–1.27)
Globulin, Total: 2.7 g/dL (ref 1.5–4.5)
Glucose: 94 mg/dL (ref 70–99)
Sodium: 140 mmol/L (ref 134–144)
Total Protein: 6.8 g/dL (ref 6.0–8.5)
eGFR: 71 mL/min/1.73 (ref >59)

## 2023-09-10 LAB — CBC WITH DIFFERENTIAL/PLATELET
Basophils Absolute: 0.1 x10E3/uL (ref 0.0–0.2)
Basos: 1 %
EOS (ABSOLUTE): 0.1 x10E3/uL (ref 0.0–0.4)
Eos: 1 %
Hematocrit: 44.2 % (ref 37.5–51.0)
Hemoglobin: 14.9 g/dL (ref 13.0–17.7)
Immature Grans (Abs): 0 x10E3/uL (ref 0.0–0.1)
Immature Granulocytes: 0 %
Lymphocytes Absolute: 1.9 x10E3/uL (ref 0.7–3.1)
Lymphs: 34 %
MCH: 33.6 pg — ABNORMAL HIGH (ref 26.6–33.0)
MCHC: 33.7 g/dL (ref 31.5–35.7)
MCV: 100 fL — ABNORMAL HIGH (ref 79–97)
Monocytes Absolute: 0.6 x10E3/uL (ref 0.1–0.9)
Monocytes: 11 %
Neutrophils Absolute: 3 x10E3/uL (ref 1.4–7.0)
Neutrophils: 53 %
Platelets: 233 x10E3/uL (ref 150–450)
RBC: 4.44 x10E6/uL (ref 4.14–5.80)
RDW: 12.9 % (ref 11.6–15.4)
WBC: 5.7 x10E3/uL (ref 3.4–10.8)

## 2023-09-10 LAB — LIPID PANEL
Chol/HDL Ratio: 3.7 ratio (ref 0.0–5.0)
Cholesterol, Total: 125 mg/dL (ref 100–199)
HDL: 34 mg/dL — ABNORMAL LOW (ref >39)
LDL Chol Calc (NIH): 66 mg/dL (ref 0–99)
Triglycerides: 145 mg/dL (ref 0–149)
VLDL Cholesterol Cal: 25 mg/dL (ref 5–40)

## 2023-09-10 LAB — TSH: TSH: 4.21 u[IU]/mL (ref 0.450–4.500)

## 2023-09-10 LAB — HEMOGLOBIN A1C
Est. average glucose Bld gHb Est-mCnc: 126 mg/dL
Hgb A1c MFr Bld: 6 % — ABNORMAL HIGH (ref 4.8–5.6)

## 2023-09-15 ENCOUNTER — Other Ambulatory Visit: Payer: Self-pay | Admitting: Physician Assistant

## 2023-09-15 NOTE — Telephone Encounter (Unsigned)
 Copied from CRM #[id number]. Topic: Clinical - Medication Refill >> Sep 15, 2023  4:13 PM Delon T wrote: Medication: olmesartan  (BENICAR ) 40 MG tablet rosuvastatin  (CRESTOR ) 20 MG tablet  Has the patient contacted their pharmacy? Yes (Agent: If no, request that the patient contact the pharmacy for the refill. If patient does not wish to contact the pharmacy document the reason why and proceed with request.) (Agent: If yes, when and what did the pharmacy advise?)  This is the patient's preferred pharmacy:  [facility] ([facility]) - [address] [place], [place] - [address] [place] [address] Phone: [phone] Fax: [phone]  Is this the correct pharmacy for this prescription? Yes If no, delete pharmacy and type the correct one.   Has the prescription been filled recently? Yes  Is the patient out of the medication? Yes  Has the patient been seen for an appointment in the last year OR does the patient have an upcoming appointment? Yes  Can we respond through MyChart? Yes  Agent: Please be advised that Rx refills may take up to 3 business days. We ask that you follow-up with your pharmacy.

## 2023-09-16 MED ORDER — ROSUVASTATIN CALCIUM 20 MG PO TABS: 20.0000 mg | ORAL_TABLET | Freq: Every day | ORAL | 0 refills | Status: AC

## 2023-09-16 MED ORDER — OLMESARTAN MEDOXOMIL 40 MG PO TABS: ORAL_TABLET | ORAL | 0 refills | Status: DC

## 2023-09-22 ENCOUNTER — Other Ambulatory Visit: Payer: Self-pay

## 2023-09-22 MED ORDER — METFORMIN HCL 500 MG PO TABS: 500.0000 mg | ORAL_TABLET | Freq: Two times a day (BID) | ORAL | 1 refills | Status: AC

## 2023-11-12 ENCOUNTER — Ambulatory Visit: Payer: Self-pay

## 2023-11-12 NOTE — Telephone Encounter (Signed)
 Patient informed he needs an appointment, appointment scheduled for 11/10.

## 2023-11-12 NOTE — Telephone Encounter (Signed)
 FYI Only or Action Required?: Action required by provider: clinical question for provider.  Patient was last seen in primary care on 09/09/2023 by Nicholaus Credit, PA-C.  Called Nurse Triage reporting Cough.  Symptoms began a week ago.  Interventions attempted: OTC medications: Sudafed, Mucinex  and NyQuil.  Symptoms are: gradually improving.  Triage Disposition: See Physician Within 24 Hours  Patient/caregiver understands and will follow disposition?: No, refuses disposition                            Copied from CRM [id number]. Topic: Clinical - Medical Advice >> Nov 12, 2023  9:48 AM Amy B wrote: Reason for CRM: Patient has cough with mucous and nose drainage.  He requests a call back to discuss treatment options.  Please call [phone]  Reason for Disposition  [1] Using nasal washes and pain medicine > 24 hours AND [2] sinus pain (around cheekbone or eye) persists    Using nasal decongestants for > 1 week.  Answer Assessment - Initial Assessment Questions This RN advised in-person evaluation. Patient declined and stated he would rather be seen virtually or have a provider send in additional medications. Patient would like to ask provider to send in medication for symptoms. Please advise.   1. ONSET: When did the cough begin?      At least a week 2. SEVERITY: How bad is the cough today?      Moderate, coughing spells last a few minutes at a time, worse at night time 3. SPUTUM: Describe the color of your sputum (e.g., none, dry cough; clear, white, yellow, green)     Dark green 4. HEMOPTYSIS: Are you coughing up any blood? If Yes, ask: How much? (e.g., flecks, streaks, tablespoons, etc.)     Denies 5. DIFFICULTY BREATHING: Are you having difficulty breathing? If Yes, ask: How bad is it? (e.g., mild, moderate, severe)      Denies, but states his nose will occasionally get congested  6. FEVER: Do you have a fever? If Yes, ask: What is your  temperature, how was it measured, and when did it start?     Denies 7. CARDIAC HISTORY: Do you have any history of heart disease? (e.g., heart attack, congestive heart failure)      Denies 8. LUNG HISTORY: Do you have any history of lung disease?  (e.g., pulmonary embolus, asthma, emphysema)     Denies 9. PE RISK FACTORS: Do you have a history of blood clots? (or: recent major surgery, recent prolonged travel, bedridden)     Yes, blood clot 3 years ago 10. OTHER SYMPTOMS: Do you have any other symptoms? (e.g., runny nose, wheezing, chest pain)     Sinus headache/pain behind eyes, denies wheezing, denies chest pain, denies sore throat  Protocols used: Cough - Acute Productive-A-AH

## 2023-11-15 ENCOUNTER — Ambulatory Visit: Admitting: Family Medicine

## 2023-11-15 ENCOUNTER — Encounter: Payer: Self-pay | Admitting: Family Medicine

## 2023-11-15 VITALS — BP 102/68 | Temp 98.3°F | Resp 16 | Ht 70.0 in | Wt 289.6 lb

## 2023-11-15 DIAGNOSIS — J0100 Acute maxillary sinusitis, unspecified: Secondary | ICD-10-CM

## 2023-11-15 DIAGNOSIS — R051 Acute cough: Secondary | ICD-10-CM | POA: Insufficient documentation

## 2023-11-15 DIAGNOSIS — G4733 Obstructive sleep apnea (adult) (pediatric): Secondary | ICD-10-CM | POA: Insufficient documentation

## 2023-11-15 HISTORY — DX: Acute cough: R05.1

## 2023-11-15 HISTORY — DX: Obstructive sleep apnea (adult) (pediatric): G47.33

## 2023-11-15 HISTORY — DX: Acute maxillary sinusitis, unspecified: J01.00

## 2023-11-15 LAB — POC COVID19/FLU A&B COMBO
Covid Antigen, POC: NEGATIVE
Influenza A Antigen, POC: NEGATIVE
Influenza B Antigen, POC: NEGATIVE

## 2023-11-15 MED ORDER — AZITHROMYCIN 250 MG PO TABS: ORAL_TABLET | ORAL | 0 refills | Status: AC

## 2023-11-15 NOTE — Assessment & Plan Note (Signed)
 History of obstructive sleep apnea Symptoms of snoring and daytime fatigue. Previous sleep studies conducted without CPAP provision. Symptoms worsened. - Ordered new sleep study to reassess need for CPAP or alternative treatments like Inspire. Orders:   Ambulatory referral to Sleep Studies
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
## 2023-11-15 NOTE — Patient Instructions (Addendum)
 SABRA

## 2023-11-15 NOTE — Assessment & Plan Note (Signed)
 Acute Flu and Covid - negative Symptoms started over a week ago. Increase fluid intake, rest and practice good hand hygeine Throw away your toothbrush and start using a new one Try lemon and honey and/or cough drops for cough  Orders:   POC Covid19/Flu A&B Antigen

## 2023-11-15 NOTE — Assessment & Plan Note (Signed)
 Acute productive cough with sinus symptoms Acute productive cough with green sputum and sinus symptoms for 1.5 weeks. No fever, night sweats, or significant ear pain. Allergic to cephalosporins. Exposed to sick grand children and symptoms still present over a week now.  - ZPack sent to pharmacy - Azithromycin  250 mg by mouth - Take 2 tablets on the first day and 1 tablet on days #2- #5 - POCT for Flu and Covid - negative Orders:   azithromycin  (ZITHROMAX ) 250 MG tablet; 2 DAILY FOR FIRST DAY, THEN DECREASE TO ONE DAILY FOR 4 MORE DAYS.

## 2023-12-10 ENCOUNTER — Other Ambulatory Visit: Payer: Self-pay | Admitting: Physician Assistant

## 2023-12-27 NOTE — Progress Notes (Signed)
 Pharmacy Quality Measure Review  This patient is appearing on a report for being at risk of failing the adherence measure for cholesterol (statin), diabetes, and hypertension (ACEi/ARB) medications this calendar year.   Medication: Rosuvastatin  20 mg Last fill date: 11/22/23 for 90 day supply  Medication: Metformin  500 mg Last fill date: 11/22/23 for 90 day supply  Medication: Olmesartan  40 mg Last fill date: 11/22/23 for 90 day supply  Insurance report was not up to date. No action needed at this time.   Jenkins Graces, PharmD PGY1 Pharmacy Resident

## 2024-02-02 DIAGNOSIS — F32A Depression, unspecified: Secondary | ICD-10-CM | POA: Insufficient documentation

## 2024-02-02 DIAGNOSIS — D039 Melanoma in situ, unspecified: Secondary | ICD-10-CM | POA: Insufficient documentation

## 2024-02-02 DIAGNOSIS — C801 Malignant (primary) neoplasm, unspecified: Secondary | ICD-10-CM | POA: Insufficient documentation

## 2024-02-02 DIAGNOSIS — K635 Polyp of colon: Secondary | ICD-10-CM | POA: Insufficient documentation

## 2024-02-02 DIAGNOSIS — T7840XA Allergy, unspecified, initial encounter: Secondary | ICD-10-CM | POA: Insufficient documentation

## 2024-02-02 DIAGNOSIS — Z87442 Personal history of urinary calculi: Secondary | ICD-10-CM | POA: Insufficient documentation

## 2024-02-05 ENCOUNTER — Other Ambulatory Visit: Payer: Self-pay | Admitting: Physician Assistant

## 2024-02-05 DIAGNOSIS — I10 Essential (primary) hypertension: Secondary | ICD-10-CM

## 2024-02-08 ENCOUNTER — Ambulatory Visit

## 2024-02-16 ENCOUNTER — Ambulatory Visit

## 2024-03-09 ENCOUNTER — Ambulatory Visit: Admitting: Physician Assistant
